# Patient Record
Sex: Male | Born: 1975 | Race: White | Hispanic: No | Marital: Single | Smoking: Current every day smoker
Health system: Southern US, Community
[De-identification: ages and names within clinical notes are randomized; demographics above are authoritative.]

## PROBLEM LIST (undated history)

## (undated) DIAGNOSIS — I10 Essential (primary) hypertension: Secondary | ICD-10-CM

## (undated) DIAGNOSIS — F329 Major depressive disorder, single episode, unspecified: Secondary | ICD-10-CM

## (undated) DIAGNOSIS — E7800 Pure hypercholesterolemia, unspecified: Secondary | ICD-10-CM

## (undated) DIAGNOSIS — F32A Depression, unspecified: Secondary | ICD-10-CM

## (undated) DIAGNOSIS — F419 Anxiety disorder, unspecified: Secondary | ICD-10-CM

## (undated) DIAGNOSIS — K769 Liver disease, unspecified: Secondary | ICD-10-CM

## (undated) HISTORY — DX: Anxiety disorder, unspecified: F41.9

## (undated) HISTORY — PX: MENISCUS REPAIR: SHX5179

## (undated) HISTORY — PX: VASECTOMY: SHX75

## (undated) HISTORY — DX: Major depressive disorder, single episode, unspecified: F32.9

## (undated) HISTORY — DX: Depression, unspecified: F32.A

---

## 2011-08-01 ENCOUNTER — Ambulatory Visit: Payer: Self-pay | Admitting: Physician Assistant

## 2011-08-01 VITALS — BP 138/90 | HR 84 | Temp 98.4°F | Resp 16 | Ht 74.0 in | Wt 184.0 lb

## 2011-08-01 DIAGNOSIS — F419 Anxiety disorder, unspecified: Secondary | ICD-10-CM | POA: Insufficient documentation

## 2011-08-01 DIAGNOSIS — F411 Generalized anxiety disorder: Secondary | ICD-10-CM

## 2011-08-01 MED ORDER — CLONAZEPAM 0.5 MG PO TABS: 0.5000 mg | ORAL_TABLET | Freq: Every day | ORAL | Status: DC

## 2011-08-01 MED ORDER — VENLAFAXINE HCL ER 150 MG PO CP24: 150.0000 mg | ORAL_CAPSULE | Freq: Every day | ORAL | Status: DC

## 2011-08-01 NOTE — Progress Notes (Signed)
  Subjective:    Patient ID: Stephen Klein, male    DOB: 1976/01/11, 36 y.o.   MRN: [id number]  HPI  Pt presents for check of his anxiety.  He is well controlled on Effexor XR 150mg  qd and Klonopin 0.5mg  tid scheduled throughout the day.  [place] the [place] 1[place] time next [place].  He is using less ETOH for anxiety, now only social drinker.  He sees Lonell Face on a prn bases, saw him last week.  He is pleased with his progress.  Review of Systems  Constitutional: Negative for fever and chills.  Psychiatric/Behavioral: The patient is nervous/anxious.        Objective:   Physical Exam  Constitutional: He appears well-developed and well-nourished.  HENT:  Head: Normocephalic and atraumatic.  Eyes: Conjunctivae are normal.  Pulmonary/Chest: Effort normal.  Psychiatric: He has a normal mood and affect. His behavior is normal. Judgment and thought content normal.          Assessment & Plan:   1. Anxiety  clonazePAM (KLONOPIN) 0.5 MG tablet   Will continue current medication regimen for the next 6 months.  Will continue with Effexor XR 150mg .  D/w pt possible increase in Effexor XR to 225mg  to see if his dose of Klonopin can be decreased.  We talked about how to determine if this can happen.  Pt will try over the next 6 months to use Klonopin as prn and if able to decrease dose will know that Effexor if at adequate dose but if unable to decrease Klonopin uses will try to increase.  It makes pt very nervous to change meds because he is so stable at this time but he wants to try this challenge.

## 2011-08-04 ENCOUNTER — Other Ambulatory Visit: Payer: Self-pay | Admitting: Physician Assistant

## 2011-08-04 DIAGNOSIS — F419 Anxiety disorder, unspecified: Secondary | ICD-10-CM

## 2011-08-04 MED ORDER — CLONAZEPAM 0.5 MG PO TABS: 0.5000 mg | ORAL_TABLET | Freq: Three times a day (TID) | ORAL | Status: DC | PRN

## 2011-09-26 ENCOUNTER — Telehealth: Payer: Self-pay | Admitting: Internal Medicine

## 2011-09-26 DIAGNOSIS — F419 Anxiety disorder, unspecified: Secondary | ICD-10-CM

## 2011-09-26 MED ORDER — CLONAZEPAM 0.5 MG PO TABS: 0.5000 mg | ORAL_TABLET | Freq: Three times a day (TID) | ORAL | Status: DC | PRN

## 2011-09-26 NOTE — Telephone Encounter (Signed)
REfilled Clonazepam 0.5 mg #90 no refills, faxed to pharmacy.

## 2011-09-27 ENCOUNTER — Other Ambulatory Visit: Payer: Self-pay | Admitting: Physician Assistant

## 2011-11-29 ENCOUNTER — Telehealth: Payer: Self-pay

## 2011-11-29 DIAGNOSIS — F419 Anxiety disorder, unspecified: Secondary | ICD-10-CM

## 2011-11-29 MED ORDER — CLONAZEPAM 0.5 MG PO TABS: 0.5000 mg | ORAL_TABLET | Freq: Three times a day (TID) | ORAL | Status: DC | PRN

## 2011-11-29 NOTE — Telephone Encounter (Signed)
Rx printed

## 2011-11-29 NOTE — Telephone Encounter (Signed)
Cherry at [facility] is requesting clonazePAM (KLONOPIN) 0.5 MG tablet pleae call [phone]

## 2011-11-30 NOTE — Telephone Encounter (Signed)
Called in Rx to pharmacy.

## 2012-01-31 ENCOUNTER — Telehealth: Payer: Self-pay

## 2012-01-31 DIAGNOSIS — F419 Anxiety disorder, unspecified: Secondary | ICD-10-CM

## 2012-01-31 MED ORDER — CLONAZEPAM 0.5 MG PO TABS: 0.5000 mg | ORAL_TABLET | Freq: Three times a day (TID) | ORAL | Status: DC | PRN

## 2012-01-31 MED ORDER — VENLAFAXINE HCL ER 150 MG PO CP24: 150.0000 mg | ORAL_CAPSULE | Freq: Every day | ORAL | Status: DC

## 2012-01-31 NOTE — Telephone Encounter (Signed)
Pharmacist called checking on faxed request she had sent for RFs on pt's Effexor and clonazepam. Can we RF this month and then pt needs to RTC?

## 2012-01-31 NOTE — Telephone Encounter (Signed)
Refills sent in/printed. Needs ov.

## 2012-02-01 ENCOUNTER — Other Ambulatory Visit: Payer: Self-pay | Admitting: Family Medicine

## 2012-02-01 DIAGNOSIS — F419 Anxiety disorder, unspecified: Secondary | ICD-10-CM

## 2012-02-01 MED ORDER — CLONAZEPAM 0.5 MG PO TABS: 0.5000 mg | ORAL_TABLET | Freq: Three times a day (TID) | ORAL | Status: DC | PRN

## 2012-02-01 NOTE — Telephone Encounter (Signed)
Attempted to call patient, but phone # disconnected I have called and spoken to pharmacy and they will advise patient he is due for follow up before these meds are renewed again.

## 2012-02-01 NOTE — Addendum Note (Signed)
Addended by: Jacqualyn Posey on: 02/01/2012 10:58 AM   Modules accepted: Orders

## 2012-03-25 ENCOUNTER — Ambulatory Visit: Payer: Self-pay | Admitting: Internal Medicine

## 2012-03-25 VITALS — BP 136/89 | HR 103 | Temp 98.5°F | Resp 16 | Ht 74.75 in | Wt 185.2 lb

## 2012-03-25 DIAGNOSIS — F411 Generalized anxiety disorder: Secondary | ICD-10-CM

## 2012-03-25 DIAGNOSIS — F17200 Nicotine dependence, unspecified, uncomplicated: Secondary | ICD-10-CM

## 2012-03-25 DIAGNOSIS — F419 Anxiety disorder, unspecified: Secondary | ICD-10-CM

## 2012-03-25 MED ORDER — CLONAZEPAM 0.5 MG PO TABS: 0.5000 mg | ORAL_TABLET | Freq: Three times a day (TID) | ORAL | Status: DC | PRN

## 2012-03-25 MED ORDER — VENLAFAXINE HCL ER 150 MG PO CP24: 150.0000 mg | ORAL_CAPSULE | Freq: Every day | ORAL | Status: AC

## 2012-03-26 DIAGNOSIS — F17200 Nicotine dependence, unspecified, uncomplicated: Secondary | ICD-10-CM | POA: Insufficient documentation

## 2012-03-26 NOTE — Progress Notes (Signed)
F/u for med refill  Problem #1 anxiety #2 nicotine addiction-not ready to address  See last OV w/ PAC [facility] 6 mos WNU:UVOZD very well with Effexor 150 XR/needs only occasional Clonopin, at the most 2 doses today and times a great stress Has only occasional counseling visits with Lonell Face but is doing well No problems with depression or substance abuse  Exam Vital signs stable Oriented to time person and place Mood stable affect good Judgment sound  Problem #1 generalized anxiety disorder  Meds ordered this encounter  Medications  . clonazePAM (KLONOPIN) 0.5 MG tablet    Sig: Take 1 tablet (0.5 mg total) by mouth 3 (three) times daily as needed for anxiety.    Dispense:  90 tablet    Refill:  5  . venlafaxine XR (EFFEXOR-XR) 150 MG 24 hr capsule    Sig: Take 1 capsule (150 mg total) by mouth daily.    Dispense:  30 capsule    Refill:  5   Recheck 6 months

## 2013-03-18 ENCOUNTER — Emergency Department (HOSPITAL_COMMUNITY)
Admission: EM | Admit: 2013-03-18 | Discharge: 2013-03-18 | Disposition: A | Discharge status: Home / Self Care | Payer: BC Managed Care – PPO | Attending: Emergency Medicine | Admitting: Emergency Medicine

## 2013-03-18 ENCOUNTER — Encounter (HOSPITAL_COMMUNITY): Payer: Self-pay | Admitting: Emergency Medicine

## 2013-03-18 DIAGNOSIS — Y9301 Activity, walking, marching and hiking: Secondary | ICD-10-CM | POA: Insufficient documentation

## 2013-03-18 DIAGNOSIS — F3289 Other specified depressive episodes: Secondary | ICD-10-CM | POA: Insufficient documentation

## 2013-03-18 DIAGNOSIS — F411 Generalized anxiety disorder: Secondary | ICD-10-CM | POA: Insufficient documentation

## 2013-03-18 DIAGNOSIS — M549 Dorsalgia, unspecified: Secondary | ICD-10-CM

## 2013-03-18 DIAGNOSIS — F329 Major depressive disorder, single episode, unspecified: Secondary | ICD-10-CM | POA: Insufficient documentation

## 2013-03-18 DIAGNOSIS — X503XXA Overexertion from repetitive movements, initial encounter: Secondary | ICD-10-CM | POA: Insufficient documentation

## 2013-03-18 DIAGNOSIS — Z79899 Other long term (current) drug therapy: Secondary | ICD-10-CM | POA: Insufficient documentation

## 2013-03-18 DIAGNOSIS — F17200 Nicotine dependence, unspecified, uncomplicated: Secondary | ICD-10-CM | POA: Insufficient documentation

## 2013-03-18 DIAGNOSIS — IMO0002 Reserved for concepts with insufficient information to code with codable children: Secondary | ICD-10-CM | POA: Insufficient documentation

## 2013-03-18 DIAGNOSIS — Z791 Long term (current) use of non-steroidal anti-inflammatories (NSAID): Secondary | ICD-10-CM | POA: Insufficient documentation

## 2013-03-18 DIAGNOSIS — Y9289 Other specified places as the place of occurrence of the external cause: Secondary | ICD-10-CM | POA: Insufficient documentation

## 2013-03-18 MED ORDER — OXYCODONE-ACETAMINOPHEN 5-325 MG PO TABS
2.0000 | ORAL_TABLET | Freq: Once | ORAL | Status: AC
  Administered 2013-03-18: 2 via ORAL
  Filled 2013-03-18: qty 2

## 2013-03-18 MED ORDER — DIAZEPAM 5 MG PO TABS: 5.0000 mg | ORAL_TABLET | Freq: Two times a day (BID) | ORAL | Status: AC

## 2013-03-18 MED ORDER — DIAZEPAM 5 MG PO TABS
5.0000 mg | ORAL_TABLET | Freq: Once | ORAL | Status: AC
  Administered 2013-03-18: 5 mg via ORAL
  Filled 2013-03-18: qty 1

## 2013-03-18 MED ORDER — HYDROCODONE-ACETAMINOPHEN 10-325 MG PO TABS: 1.0000 | ORAL_TABLET | Freq: Four times a day (QID) | ORAL | Status: DC | PRN

## 2013-03-18 MED ORDER — HYDROMORPHONE HCL PF 1 MG/ML IJ SOLN
1.0000 mg | Freq: Once | INTRAMUSCULAR | Status: AC
  Administered 2013-03-18: 1 mg via INTRAMUSCULAR
  Filled 2013-03-18: qty 1

## 2013-03-18 NOTE — ED Provider Notes (Signed)
CSN: [id number]     Arrival date & time 03/18/13  1617 History   First MD Initiated Contact with Patient 03/18/13 1636     Chief Complaint  Patient presents with  . Back Pain    HPI Patient presents with low back pain.  The pain is across the lower back with radiation down the posterior of the right leg.  Pain began approximately one month ago after a seemingly innocuous event.  The patient was walking dogs, felt a pop, and since that time has had pain persistently in the lower back.  Pain is severe, incapacitating.  Patient has seen neurosurgery, had MRI.  This was a week ago.  Patient notes that there is incomplete relief with Skelaxin, Norco, ibuprofen. Today, the patient denies incontinence, bowel or bladder changes, no lower extremity weakness or dysesthesia.  He also denies any fevers, chills, nausea, vomiting.  Past Medical History  Diagnosis Date  . Depression   . Anxiety    History reviewed. No pertinent past surgical history. History reviewed. No pertinent family history. History  Substance Use Topics  . Smoking status: Current Every Day Smoker -- 1.00 packs/day for 8 years  . Smokeless tobacco: Not on file  . Alcohol Use: 7.5 oz/week    15 drink(s) per week    Review of Systems  Constitutional:       Per HPI, otherwise negative  HENT:       Per HPI, otherwise negative  Respiratory:       Per HPI, otherwise negative  Cardiovascular:       Per HPI, otherwise negative  Gastrointestinal: Negative for vomiting.  Endocrine:       Negative aside from HPI  Genitourinary:       Neg aside from HPI   Musculoskeletal:       Per HPI, otherwise negative  Skin: Negative.   Neurological: Negative for syncope.    Allergies  Sulfa antibiotics  Home Medications   Current Outpatient Rx  Name  Route  Sig  Dispense  Refill  . clonazePAM (KLONOPIN) 0.5 MG tablet   Oral   Take 1 tablet (0.5 mg total) by mouth 3 (three) times daily as needed for anxiety.   90 tablet   5    . diclofenac sodium (VOLTAREN) 1 % GEL   Topical   Apply 2 g topically 2 (two) times daily.         Marland Kitchen HYDROcodone-acetaminophen (NORCO/VICODIN) 5-325 MG per tablet   Oral   Take 1 tablet by mouth every 6 (six) hours as needed for pain.         . metaxalone (SKELAXIN) 800 MG tablet   Oral   Take 800 mg by mouth 3 (three) times daily.         . Multiple Vitamin (MULTIVITAMIN WITH MINERALS) TABS tablet   Oral   Take 1 tablet by mouth daily.         Marland Kitchen venlafaxine XR (EFFEXOR-XR) 150 MG 24 hr capsule   Oral   Take 1 capsule (150 mg total) by mouth daily.   30 capsule   5    BP 176/99  Pulse 112  Temp(Src) 98 F (36.7 C) (Oral)  Resp 16  SpO2 98% Physical Exam  Nursing note and vitals reviewed. Constitutional: He is oriented to person, place, and time. He appears well-developed. No distress.  HENT:  Head: Normocephalic and atraumatic.  Eyes: Conjunctivae and EOM are normal.  Cardiovascular: Normal rate and regular rhythm.  Pulmonary/Chest: Effort normal. No stridor. No respiratory distress.  Abdominal: He exhibits no distension.  Musculoskeletal: He exhibits no edema.  No gross deformities throughout the lower back or legs.  There is mild tenderness to palpation across the top of the sacrum, with no asymmetry, no visible abnormalities.  Neurological: He is alert and oriented to person, place, and time.  Patient has appropriate sensation throughout.  Strength in the right thigh is minimally less than the left side.  Otherwise unremarkable neurologic exam the  Skin: Skin is warm and dry.  Psychiatric: He has a normal mood and affect.    ED Course  Procedures (including critical care time) Labs Review Labs Reviewed - No data to display Imaging Review No results found.  EKG Interpretation   None      after the initial evaluation I reviewed the patient's MRI results, and the interpretation.  Interpretation is notable for being essentially normal.   7:12  PM Patient has some improvement in his level of pain.  He denies any new concerns. I had a lengthy discussion with him and his wife about back pain, the need for analgesia, ongoing management with primary care and neurosurgery/orthopedics. MDM  No diagnosis found. This patient presents with concern ongoing low back pain.  On exam the patient is neurologically intact.  Patient's MRI does not demonstrate acute pathology in the lumbar spine.  Patient remained medically stable throughout his emergency department course.  Following improvement in his pain level him a progress for discharge with further evaluation and management as an outpatient.  He started on a new course of medication after a lengthy discussion on back pain, return precautions, follow up instructions.    Gerhard Munch, MD 03/18/13 [phone]

## 2013-03-18 NOTE — ED Notes (Signed)
Presents with back pain began in September, went to spine clinic, had MRI last Monday has not been able to get back in touch with spine clinic since. Pt is unable to get pain under control. Denies numbness, tingling in legs, denies loss of bowel and bladder.

## 2014-05-12 ENCOUNTER — Ambulatory Visit (HOSPITAL_BASED_OUTPATIENT_CLINIC_OR_DEPARTMENT_OTHER): Payer: BC Managed Care – PPO | Attending: Family Medicine | Admitting: Radiology

## 2014-05-12 VITALS — Ht 74.0 in | Wt 200.0 lb

## 2014-05-12 DIAGNOSIS — G4733 Obstructive sleep apnea (adult) (pediatric): Secondary | ICD-10-CM | POA: Insufficient documentation

## 2014-05-17 DIAGNOSIS — G4733 Obstructive sleep apnea (adult) (pediatric): Secondary | ICD-10-CM

## 2014-05-17 NOTE — Sleep Study (Signed)
   NAME: Stephen MaskerDavid Enerson DATE OF BIRTH:  [birth year] MEDICAL RECORD NUMBER [id number]  LOCATION: [facility]  PHYSICIAN: YOUNG,CLINTON D  DATE OF STUDY: 05/12/2014  SLEEP STUDY TYPE: Nocturnal Polysomnogram               REFERRING PHYSICIAN: [name], [name], MD  INDICATION FOR STUDY: Hypersomnia with sleep apnea  EPWORTH SLEEPINESS SCORE:   19/24 HEIGHT: 6\' 2"  (188 cm)  WEIGHT: 200 lb (90.719 kg)    Body mass index is 25.67 kg/(m^2).  NECK SIZE: 15 in.  MEDICATIONS: Charted for review  SLEEP ARCHITECTURE: Split study protocol. During the diagnostic phase, total sleep time 130.5 minutes with sleep efficiency 72.1%. Stage I was 8.4%, stage II 84.3%, stage III absent, REM 7.3% of total sleep time. Sleep latency 44 minutes, REM latency 110 minutes, awake after sleep onset 4.5 minutes, arousal index 23.9, bedtime medication: Effexor, Klonopin  RESPIRATORY DATA: Apnea hypopnea index (AHI) 36.3 per hour. 79 total events scored including 15 obstructive apneas and 64 hypopneas. Events were more common while supine. REM AHI 94.7 per hour. CPAP titration to 12 CWP, AHI 1.1 per hour. He wore a large fullface mask.  OXYGEN DATA: Moderately loud snoring before CPAP with oxygen desaturation to a nadir of 79% on room air. With CPAP control, snoring was prevented and mean oxygen saturation was 95.2%.  CARDIAC DATA: Normal sinus rhythm  MOVEMENT/PARASOMNIA: No significant movement disturbance, no bathroom trips  IMPRESSION/ RECOMMENDATION:   1) Severe obstructive sleep apnea/hypopnea syndrome, AHI 36.3 per hour with events were, in while supine. REM AHI 94.7 per hour. Moderate snoring with oxygen desaturation to a nadir of 79% on room air. 2) Successful CPAP titration to 12 CWP, AHI 1.1 per hour. He wore a large ResMed AirFit F-10 fullface mask with heated humidifier. Snoring was prevented and mean oxygen saturation was 95.2%.   Waymon BudgeYOUNG,CLINTON D Diplomate, American Board of Sleep  Medicine  ELECTRONICALLY SIGNED ON:  05/17/2014, 9:55 AM [facility] PH: [phone]   FX: [phone] ACCREDITED BY THE AMERICAN ACADEMY OF SLEEP MEDICINE

## 2014-07-11 ENCOUNTER — Encounter (HOSPITAL_COMMUNITY): Payer: Self-pay | Admitting: Emergency Medicine

## 2014-07-11 ENCOUNTER — Emergency Department (HOSPITAL_COMMUNITY): Payer: BLUE CROSS/BLUE SHIELD

## 2014-07-11 ENCOUNTER — Inpatient Hospital Stay (HOSPITAL_COMMUNITY)
Admission: EM | Admit: 2014-07-11 | Discharge: 2014-07-14 | DRG: 872 | Disposition: A | Discharge status: Home / Self Care | Payer: BLUE CROSS/BLUE SHIELD | Attending: Internal Medicine | Admitting: Internal Medicine

## 2014-07-11 DIAGNOSIS — F1099 Alcohol use, unspecified with unspecified alcohol-induced disorder: Secondary | ICD-10-CM | POA: Diagnosis present

## 2014-07-11 DIAGNOSIS — K760 Fatty (change of) liver, not elsewhere classified: Secondary | ICD-10-CM | POA: Diagnosis present

## 2014-07-11 DIAGNOSIS — I2510 Atherosclerotic heart disease of native coronary artery without angina pectoris: Secondary | ICD-10-CM | POA: Diagnosis present

## 2014-07-11 DIAGNOSIS — D696 Thrombocytopenia, unspecified: Secondary | ICD-10-CM | POA: Diagnosis present

## 2014-07-11 DIAGNOSIS — Z7289 Other problems related to lifestyle: Secondary | ICD-10-CM

## 2014-07-11 DIAGNOSIS — J029 Acute pharyngitis, unspecified: Secondary | ICD-10-CM | POA: Diagnosis present

## 2014-07-11 DIAGNOSIS — K769 Liver disease, unspecified: Secondary | ICD-10-CM | POA: Diagnosis present

## 2014-07-11 DIAGNOSIS — E871 Hypo-osmolality and hyponatremia: Secondary | ICD-10-CM | POA: Diagnosis present

## 2014-07-11 DIAGNOSIS — N419 Inflammatory disease of prostate, unspecified: Secondary | ICD-10-CM | POA: Diagnosis present

## 2014-07-11 DIAGNOSIS — Z882 Allergy status to sulfonamides status: Secondary | ICD-10-CM

## 2014-07-11 DIAGNOSIS — R7989 Other specified abnormal findings of blood chemistry: Secondary | ICD-10-CM | POA: Diagnosis present

## 2014-07-11 DIAGNOSIS — R109 Unspecified abdominal pain: Secondary | ICD-10-CM

## 2014-07-11 DIAGNOSIS — N12 Tubulo-interstitial nephritis, not specified as acute or chronic: Secondary | ICD-10-CM | POA: Diagnosis present

## 2014-07-11 DIAGNOSIS — I10 Essential (primary) hypertension: Secondary | ICD-10-CM | POA: Diagnosis present

## 2014-07-11 DIAGNOSIS — E785 Hyperlipidemia, unspecified: Secondary | ICD-10-CM | POA: Diagnosis present

## 2014-07-11 DIAGNOSIS — Z79899 Other long term (current) drug therapy: Secondary | ICD-10-CM

## 2014-07-11 DIAGNOSIS — A419 Sepsis, unspecified organism: Principal | ICD-10-CM | POA: Diagnosis present

## 2014-07-11 DIAGNOSIS — E780 Pure hypercholesterolemia: Secondary | ICD-10-CM | POA: Diagnosis present

## 2014-07-11 DIAGNOSIS — Z789 Other specified health status: Secondary | ICD-10-CM

## 2014-07-11 DIAGNOSIS — R945 Abnormal results of liver function studies: Secondary | ICD-10-CM | POA: Diagnosis present

## 2014-07-11 DIAGNOSIS — K5900 Constipation, unspecified: Secondary | ICD-10-CM | POA: Diagnosis present

## 2014-07-11 DIAGNOSIS — B951 Streptococcus, group B, as the cause of diseases classified elsewhere: Secondary | ICD-10-CM

## 2014-07-11 DIAGNOSIS — F419 Anxiety disorder, unspecified: Secondary | ICD-10-CM | POA: Diagnosis present

## 2014-07-11 DIAGNOSIS — F329 Major depressive disorder, single episode, unspecified: Secondary | ICD-10-CM | POA: Diagnosis present

## 2014-07-11 DIAGNOSIS — F17210 Nicotine dependence, cigarettes, uncomplicated: Secondary | ICD-10-CM | POA: Diagnosis present

## 2014-07-11 HISTORY — DX: Liver disease, unspecified: K76.9

## 2014-07-11 HISTORY — DX: Essential (primary) hypertension: I10

## 2014-07-11 HISTORY — DX: Pure hypercholesterolemia, unspecified: E78.00

## 2014-07-11 LAB — COMPREHENSIVE METABOLIC PANEL
ALK PHOS: 41 U/L (ref 39–117)
ALT: 98 U/L — ABNORMAL HIGH (ref 0–53)
ANION GAP: 10 (ref 5–15)
AST: 48 U/L — AB (ref 0–37)
Albumin: 4.4 g/dL (ref 3.5–5.2)
BILIRUBIN TOTAL: 0.6 mg/dL (ref 0.3–1.2)
BUN: 8 mg/dL (ref 6–23)
CALCIUM: 9 mg/dL (ref 8.4–10.5)
CO2: 23 mmol/L (ref 19–32)
CREATININE: 0.82 mg/dL (ref 0.50–1.35)
Chloride: 98 mmol/L (ref 96–112)
GFR calc Af Amer: >90 mL/min (ref >90)
Glucose, Bld: 145 mg/dL — ABNORMAL HIGH (ref 70–99)
Potassium: 3.8 mmol/L (ref 3.5–5.1)
Sodium: 131 mmol/L — ABNORMAL LOW (ref 135–145)
Total Protein: 7.4 g/dL (ref 6.0–8.3)

## 2014-07-11 LAB — URINALYSIS, ROUTINE W REFLEX MICROSCOPIC
Bilirubin Urine: NEGATIVE
Glucose, UA: NEGATIVE mg/dL
Hgb urine dipstick: NEGATIVE
Ketones, ur: NEGATIVE mg/dL
LEUKOCYTES UA: NEGATIVE
Nitrite: NEGATIVE
PROTEIN: NEGATIVE mg/dL
SPECIFIC GRAVITY, URINE: 1.003 — AB (ref 1.005–1.030)
UROBILINOGEN UA: 0.2 mg/dL (ref 0.0–1.0)
pH: 6.5 (ref 5.0–8.0)

## 2014-07-11 LAB — CBC WITH DIFFERENTIAL/PLATELET
BASOS ABS: 0 10*3/uL (ref 0.0–0.1)
BASOS PCT: 0 % (ref 0–1)
EOS PCT: 1 % (ref 0–5)
Eosinophils Absolute: 0.1 10*3/uL (ref 0.0–0.7)
HCT: 43 % (ref 39.0–52.0)
HEMOGLOBIN: 15 g/dL (ref 13.0–17.0)
LYMPHS PCT: 11 % — AB (ref 12–46)
Lymphs Abs: 1.4 10*3/uL (ref 0.7–4.0)
MCH: 29.5 pg (ref 26.0–34.0)
MCHC: 34.9 g/dL (ref 30.0–36.0)
MCV: 84.5 fL (ref 78.0–100.0)
MONO ABS: 1.2 10*3/uL — AB (ref 0.1–1.0)
Monocytes Relative: 9 % (ref 3–12)
Neutro Abs: 10.6 10*3/uL — ABNORMAL HIGH (ref 1.7–7.7)
Neutrophils Relative %: 79 % — ABNORMAL HIGH (ref 43–77)
Platelets: 143 10*3/uL — ABNORMAL LOW (ref 150–400)
RBC: 5.09 MIL/uL (ref 4.22–5.81)
RDW: 12.7 % (ref 11.5–15.5)
WBC: 13.4 10*3/uL — ABNORMAL HIGH (ref 4.0–10.5)

## 2014-07-11 LAB — RAPID STREP SCREEN (MED CTR MEBANE ONLY): Streptococcus, Group A Screen (Direct): POSITIVE — AB

## 2014-07-11 LAB — I-STAT CG4 LACTIC ACID, ED: Lactic Acid, Venous: 2.01 mmol/L (ref 0.5–2.0)

## 2014-07-11 MED ORDER — POLYETHYLENE GLYCOL 3350 17 G PO PACK
17.0000 g | PACK | Freq: Every day | ORAL | Status: DC
  Filled 2014-07-11 (×4): qty 1

## 2014-07-11 MED ORDER — LORAZEPAM 1 MG PO TABS: 1.0000 mg | ORAL_TABLET | Freq: Four times a day (QID) | ORAL | Status: AC | PRN

## 2014-07-11 MED ORDER — IOHEXOL 300 MG/ML  SOLN
25.0000 mL | Freq: Once | INTRAMUSCULAR | Status: AC | PRN
  Administered 2014-07-11: 25 mL via ORAL

## 2014-07-11 MED ORDER — LORAZEPAM 2 MG/ML IJ SOLN: 1.0000 mg | Freq: Four times a day (QID) | INTRAMUSCULAR | Status: AC | PRN

## 2014-07-11 MED ORDER — PIPERACILLIN-TAZOBACTAM 3.375 G IVPB 30 MIN: 3.3750 g | Freq: Once | INTRAVENOUS | Status: DC

## 2014-07-11 MED ORDER — ONDANSETRON HCL 4 MG PO TABS: 4.0000 mg | ORAL_TABLET | Freq: Four times a day (QID) | ORAL | Status: DC | PRN

## 2014-07-11 MED ORDER — FOLIC ACID 1 MG PO TABS
1.0000 mg | ORAL_TABLET | Freq: Every day | ORAL | Status: DC
  Administered 2014-07-11 – 2014-07-14 (×4): 1 mg via ORAL
  Filled 2014-07-11 (×4): qty 1

## 2014-07-11 MED ORDER — VENLAFAXINE HCL ER 150 MG PO CP24
150.0000 mg | ORAL_CAPSULE | Freq: Every day | ORAL | Status: DC
  Administered 2014-07-11 – 2014-07-13 (×3): 150 mg via ORAL
  Filled 2014-07-11 (×5): qty 1

## 2014-07-11 MED ORDER — CLONAZEPAM 0.5 MG PO TABS
0.5000 mg | ORAL_TABLET | Freq: Three times a day (TID) | ORAL | Status: DC
  Administered 2014-07-11 – 2014-07-14 (×10): 0.5 mg via ORAL
  Filled 2014-07-11 (×10): qty 1

## 2014-07-11 MED ORDER — ADULT MULTIVITAMIN W/MINERALS CH
1.0000 | ORAL_TABLET | Freq: Every day | ORAL | Status: DC
  Administered 2014-07-11 – 2014-07-14 (×4): 1 via ORAL
  Filled 2014-07-11 (×4): qty 1

## 2014-07-11 MED ORDER — DEXTROSE 5 % IV SOLN
2.0000 g | Freq: Once | INTRAVENOUS | Status: AC
  Administered 2014-07-11: 2 g via INTRAVENOUS
  Filled 2014-07-11: qty 2

## 2014-07-11 MED ORDER — SODIUM CHLORIDE 0.9 % IV BOLUS (SEPSIS)
1000.0000 mL | Freq: Once | INTRAVENOUS | Status: AC
  Administered 2014-07-11: 1000 mL via INTRAVENOUS

## 2014-07-11 MED ORDER — PHENOL 1.4 % MT LIQD
1.0000 | OROMUCOSAL | Status: DC | PRN
Start: 2014-07-11 — End: 2014-07-14
  Filled 2014-07-11 (×2): qty 177

## 2014-07-11 MED ORDER — IOHEXOL 300 MG/ML  SOLN
100.0000 mL | Freq: Once | INTRAMUSCULAR | Status: AC | PRN
  Administered 2014-07-11: 100 mL via INTRAVENOUS

## 2014-07-11 MED ORDER — SODIUM CHLORIDE 0.9 % IV BOLUS (SEPSIS)
2000.0000 mL | Freq: Once | INTRAVENOUS | Status: AC
  Administered 2014-07-11: 2000 mL via INTRAVENOUS

## 2014-07-11 MED ORDER — ACETAMINOPHEN 325 MG PO TABS
650.0000 mg | ORAL_TABLET | Freq: Four times a day (QID) | ORAL | Status: DC | PRN
  Administered 2014-07-11: 650 mg via ORAL
  Filled 2014-07-11: qty 2

## 2014-07-11 MED ORDER — THIAMINE HCL 100 MG/ML IJ SOLN
100.0000 mg | Freq: Every day | INTRAMUSCULAR | Status: DC
  Filled 2014-07-11 (×4): qty 1

## 2014-07-11 MED ORDER — ASPIRIN EC 81 MG PO TBEC
81.0000 mg | DELAYED_RELEASE_TABLET | Freq: Every day | ORAL | Status: DC
  Administered 2014-07-11 – 2014-07-14 (×4): 81 mg via ORAL
  Filled 2014-07-11 (×4): qty 1

## 2014-07-11 MED ORDER — ZOLPIDEM TARTRATE 5 MG PO TABS
5.0000 mg | ORAL_TABLET | Freq: Every evening | ORAL | Status: DC | PRN
Start: 2014-07-11 — End: 2014-07-14

## 2014-07-11 MED ORDER — TAMSULOSIN HCL 0.4 MG PO CAPS
0.4000 mg | ORAL_CAPSULE | Freq: Every day | ORAL | Status: DC
  Administered 2014-07-12 – 2014-07-14 (×3): 0.4 mg via ORAL
  Filled 2014-07-11 (×4): qty 1

## 2014-07-11 MED ORDER — VITAMIN B-1 100 MG PO TABS
100.0000 mg | ORAL_TABLET | Freq: Every day | ORAL | Status: DC
  Administered 2014-07-11 – 2014-07-14 (×4): 100 mg via ORAL
  Filled 2014-07-11 (×4): qty 1

## 2014-07-11 MED ORDER — MORPHINE SULFATE 2 MG/ML IJ SOLN
2.0000 mg | INTRAMUSCULAR | Status: DC | PRN
  Administered 2014-07-11 (×2): 4 mg via INTRAVENOUS
  Administered 2014-07-11: 2 mg via INTRAVENOUS
  Administered 2014-07-12 – 2014-07-13 (×7): 4 mg via INTRAVENOUS
  Administered 2014-07-13 (×2): 2 mg via INTRAVENOUS
  Administered 2014-07-13: 4 mg via INTRAVENOUS
  Administered 2014-07-13: 2 mg via INTRAVENOUS
  Administered 2014-07-13: 4 mg via INTRAVENOUS
  Administered 2014-07-13 – 2014-07-14 (×2): 2 mg via INTRAVENOUS
  Administered 2014-07-14 (×2): 4 mg via INTRAVENOUS
  Filled 2014-07-11: qty 1
  Filled 2014-07-11 (×2): qty 2
  Filled 2014-07-11: qty 1
  Filled 2014-07-11 (×3): qty 2
  Filled 2014-07-11: qty 1
  Filled 2014-07-11 (×9): qty 2
  Filled 2014-07-11: qty 1
  Filled 2014-07-11: qty 2

## 2014-07-11 MED ORDER — CEFTRIAXONE SODIUM IN DEXTROSE 20 MG/ML IV SOLN
1.0000 g | INTRAVENOUS | Status: DC
  Administered 2014-07-12 – 2014-07-14 (×3): 1 g via INTRAVENOUS
  Filled 2014-07-11 (×3): qty 50

## 2014-07-11 MED ORDER — DOCUSATE SODIUM 100 MG PO CAPS
100.0000 mg | ORAL_CAPSULE | Freq: Two times a day (BID) | ORAL | Status: DC
  Administered 2014-07-12 – 2014-07-14 (×3): 100 mg via ORAL
  Filled 2014-07-11 (×9): qty 1

## 2014-07-11 MED ORDER — DILTIAZEM HCL ER 240 MG PO CP24
240.0000 mg | ORAL_CAPSULE | Freq: Every day | ORAL | Status: DC
  Administered 2014-07-11 – 2014-07-13 (×3): 240 mg via ORAL
  Filled 2014-07-11 (×6): qty 1

## 2014-07-11 MED ORDER — ACETAMINOPHEN 650 MG RE SUPP: 650.0000 mg | Freq: Four times a day (QID) | RECTAL | Status: DC | PRN

## 2014-07-11 MED ORDER — MAGIC MOUTHWASH
5.0000 mL | Freq: Three times a day (TID) | ORAL | Status: DC
  Administered 2014-07-11 – 2014-07-14 (×8): 5 mL via ORAL
  Filled 2014-07-11 (×11): qty 5

## 2014-07-11 MED ORDER — OXYCODONE HCL 5 MG PO TABS: 5.0000 mg | ORAL_TABLET | ORAL | Status: DC | PRN

## 2014-07-11 MED ORDER — SODIUM CHLORIDE 0.9 % IV SOLN
INTRAVENOUS | Status: DC
  Administered 2014-07-11: 1000 mL via INTRAVENOUS
  Administered 2014-07-11 – 2014-07-13 (×5): via INTRAVENOUS

## 2014-07-11 MED ORDER — MORPHINE SULFATE 4 MG/ML IJ SOLN
6.0000 mg | Freq: Once | INTRAMUSCULAR | Status: AC
  Administered 2014-07-11: 6 mg via INTRAVENOUS
  Filled 2014-07-11: qty 2

## 2014-07-11 MED ORDER — ACETAMINOPHEN 500 MG PO TABS
1000.0000 mg | ORAL_TABLET | Freq: Once | ORAL | Status: AC
  Administered 2014-07-11: 1000 mg via ORAL
  Filled 2014-07-11: qty 2

## 2014-07-11 MED ORDER — HYDROMORPHONE HCL 1 MG/ML IJ SOLN
1.0000 mg | Freq: Once | INTRAMUSCULAR | Status: AC
  Administered 2014-07-11: 1 mg via INTRAVENOUS
  Filled 2014-07-11: qty 1

## 2014-07-11 MED ORDER — ONDANSETRON HCL 4 MG/2ML IJ SOLN: 4.0000 mg | Freq: Four times a day (QID) | INTRAMUSCULAR | Status: DC | PRN

## 2014-07-11 MED ORDER — KETOROLAC TROMETHAMINE 30 MG/ML IJ SOLN
30.0000 mg | Freq: Once | INTRAMUSCULAR | Status: AC
  Administered 2014-07-11: 30 mg via INTRAVENOUS
  Filled 2014-07-11: qty 1

## 2014-07-11 MED ORDER — ALUM & MAG HYDROXIDE-SIMETH 200-200-20 MG/5ML PO SUSP: 30.0000 mL | Freq: Four times a day (QID) | ORAL | Status: DC | PRN

## 2014-07-11 NOTE — ED Notes (Signed)
Pt. reports low back pain with dysuria , bladder pressure and fever onset today .

## 2014-07-11 NOTE — ED Provider Notes (Signed)
CSN: [id number]     Arrival date & time 07/11/14  0031 History   None    This chart was scribed for Tomasita Crumble, MD by Arlan Organ, ED Scribe. This patient was seen in room B18C/B18C and the patient's care was started 1:28 AM.   Chief Complaint  Patient presents with  . Back Pain  . Dysuria  . Fever   The history is provided by the patient. No language interpreter was used.    HPI Comments: Nedim Oki is a 39 y.o. male with a PMHx of HTN, ventral hernia and liver disease who presents to the Emergency Department complaining of constant, moderate lower back pain onset 3 PM this afternoon. He also reports urinary frequency, HA, chills,  "pressure" when urinating, sore throat and fever. He has not tried any OTC medications or home remedies to help manage symptoms. No recent testicular swelling, testicular pain, or vomiting. Mr. Pitstick denies any change in eating habits since onset of symptoms. Pt with known allergies to sulfa antibiotics.   Past Medical History  Diagnosis Date  . Depression   . Anxiety   . Hypertension   . Hypercholesteremia   . Liver disease    Past Surgical History  Procedure Laterality Date  . Vasectomy     No family history on file. History  Substance Use Topics  . Smoking status: Current Every Day Smoker -- 1.00 packs/day for 8 years  . Smokeless tobacco: Not on file  . Alcohol Use: 7.5 oz/week    15 Standard drinks or equivalent per week    Review of Systems  Constitutional: Positive for fever and chills.  HENT: Positive for sore throat.   Gastrointestinal: Positive for nausea and abdominal pain. Negative for vomiting and diarrhea.  Genitourinary: Positive for frequency. Negative for scrotal swelling and testicular pain.  Musculoskeletal: Positive for back pain.  Neurological: Positive for headaches.      Allergies  Sulfa antibiotics  Home Medications   Prior to Admission medications   Medication Sig Start Date End Date Taking? Authorizing  Provider  diclofenac sodium (VOLTAREN) 1 % GEL Apply 2 g topically 2 (two) times daily.    Historical Provider, MD  HYDROcodone-acetaminophen (NORCO) 10-325 MG per tablet Take 1 tablet by mouth every 6 (six) hours as needed for pain. 03/18/13   Gerhard Munch, MD  Multiple Vitamin (MULTIVITAMIN WITH MINERALS) TABS tablet Take 1 tablet by mouth daily.    Historical Provider, MD  venlafaxine XR (EFFEXOR-XR) 150 MG 24 hr capsule Take 1 capsule (150 mg total) by mouth daily. 03/25/12   Tonye Pearson, MD   Triage Vitals: BP 118/71 mmHg  Pulse 128  Temp(Src) 102.3 F (39.1 C) (Oral)  Resp 14  Ht  (1.88 m)  Wt 200 lb (90.719 kg)  BMI 25.67 kg/m2  SpO2 98%   Physical Exam  Constitutional: He is oriented to person, place, and time. Vital signs are normal. He appears well-developed and well-nourished.  Non-toxic appearance. He does not appear ill. No distress.  HENT:  Head: Normocephalic and atraumatic.  Nose: Nose normal.  Mouth/Throat: Oropharynx is clear and moist. No oropharyngeal exudate.  Eyes: Conjunctivae and EOM are normal. Pupils are equal, round, and reactive to light. No scleral icterus.  Neck: Normal range of motion. Neck supple. No tracheal deviation, no edema, no erythema and normal range of motion present. No thyroid mass and no thyromegaly present.  Cardiovascular: Regular rhythm, S1 normal, S2 normal, normal heart sounds, intact distal pulses and  normal pulses.  Tachycardia present.  Exam reveals no gallop and no friction rub.   No murmur heard. Pulses:      Radial pulses are 2+ on the right side, and 2+ on the left side.       Dorsalis pedis pulses are 2+ on the right side, and 2+ on the left side.  Pulmonary/Chest: Effort normal and breath sounds normal. No respiratory distress. He has no wheezes. He has no rhonchi. He has no rales.  Abdominal: Soft. Normal appearance and bowel sounds are normal. He exhibits no distension, no ascites and no mass. There is no  hepatosplenomegaly. There is tenderness. There is rebound. There is no guarding and no CVA tenderness.  RLQ tender to palpation   Musculoskeletal: Normal range of motion. He exhibits no edema or tenderness.  Lymphadenopathy:    He has no cervical adenopathy.  Neurological: He is alert and oriented to person, place, and time. He has normal strength. No cranial nerve deficit or sensory deficit.  Skin: Skin is warm, dry and intact. No petechiae and no rash noted. He is not diaphoretic. No erythema. No pallor.  Psychiatric: He has a normal mood and affect. His behavior is normal. Judgment normal.  Nursing note and vitals reviewed.   ED Course  Procedures (including critical care time)  DIAGNOSTIC STUDIES: Oxygen Saturation is 98% on RA, Normal by my interpretation.    COORDINATION OF CARE: 1:41 AM- Discussed treatment plan with pt at bedside and pt agreed to plan.     Labs Review Labs Reviewed  CBC WITH DIFFERENTIAL/PLATELET - Abnormal; Notable for the following:    WBC 13.4 (*)    Platelets 143 (*)    Neutrophils Relative % 79 (*)    Neutro Abs 10.6 (*)    Lymphocytes Relative 11 (*)    Monocytes Absolute 1.2 (*)    All other components within normal limits  COMPREHENSIVE METABOLIC PANEL - Abnormal; Notable for the following:    Sodium 131 (*)    Glucose, Bld 145 (*)    AST 48 (*)    ALT 98 (*)    All other components within normal limits  URINALYSIS, ROUTINE W REFLEX MICROSCOPIC - Abnormal; Notable for the following:    Specific Gravity, Urine 1.003 (*)    All other components within normal limits  I-STAT CG4 LACTIC ACID, ED - Abnormal; Notable for the following:    Lactic Acid, Venous 2.01 (*)    All other components within normal limits  URINE CULTURE  CULTURE, BLOOD (ROUTINE X 2)  CULTURE, BLOOD (ROUTINE X 2)    Imaging Review Ct Abdomen Pelvis W Contrast  07/11/2014   CLINICAL DATA:  Moderate constant low back pain beginning at 3 p.m. yesterday. Urinary frequency,  headache, sore throat, fever. History of hypertension, ventral hernia and liver disease.  EXAM: CT ABDOMEN AND PELVIS WITH CONTRAST  TECHNIQUE: Multidetector CT imaging of the abdomen and pelvis was performed using the standard protocol following bolus administration of intravenous contrast.  CONTRAST:  100mL OMNIPAQUE IOHEXOL 300 MG/ML  SOLN  COMPARISON:  None.  FINDINGS: LUNG BASES: Included view of the lung bases demonstrate LEFT lower lobe atelectasis. Visualized heart and pericardium are unremarkable. Pectus excavatum  SOLID ORGANS: The spleen, gallbladder, pancreas and adrenal glands are unremarkable. The liver is diffusely hypodense consistent with hepatic steatosis and otherwise unremarkable.  GASTROINTESTINAL TRACT: The stomach, small and large bowel are normal in course and caliber without inflammatory changes. Normal retrocecal appendix. Moderate amount of  retained large bowel stool.  KIDNEYS/ URINARY TRACT: Kidneys are orthotopic, demonstrating symmetric enhancement. No nephrolithiasis, hydronephrosis or solid renal masses. Nonspecific LEFT perirenal nephric stranding. The unopacified ureters are normal in course and caliber. Urinary bladder is partially distended and unremarkable.  PERITONEUM/RETROPERITONEUM: Aortoiliac vessels are normal in course and caliber, trace calcific atherosclerosis. No lymphadenopathy by CT size criteria. Prostate is enlarged, 5.8 x 4.7 cm. No intraperitoneal free fluid nor free air.  SOFT TISSUE/OSSEOUS STRUCTURES: Non-suspicious. No acute fracture or malalignment of the lumbar spine, no advanced degenerative change for age. Scattered Schmorl's nodes.  IMPRESSION: Nonspecific LEFT perinephric stranding.  Prostatomegaly.  Moderate amount of retained large bowel stool without bowel obstruction.   Electronically Signed   By: Awilda Metro   On: 07/11/2014 04:01     EKG Interpretation   Date/Time:  Saturday July 11 2014 01:05:39 EST Ventricular Rate:  123 PR  Interval:  159 QRS Duration: 97 QT Interval:  296 QTC Calculation: 423 R Axis:   30 Text Interpretation:  Sinus tachycardia Probable left atrial enlargement  Confirmed by Erroll Luna [phone] on 07/11/2014 1:24:52 AM      MDM   Final diagnoses:  None    Patient presents to the emergency department for low back pain, increased urinary frequency, mild abdominal pain. He denies this ever occurring to him in the past. Urinalysis does not show an infection. Abdominal exam is equivocal. He intermittently has right upper and right lower quadrant tenderness to palpation. However patient does have fever to 102 and tachycardia to 120 this will obtain CT scan the abdomen to evaluate for infection.  Patient was given Toradol, morphine, IV fluids in the emergency department. CT scan reveals a left-sided perinephric stranding, concerning for pyelonephritis. Blood cultures were drawn, patient was given 2 g of ceftriaxone emergency department. He continues to be tachycardic and febrile despite interventions. Another fluid bolus was ordered. Patient is admitted to [facility], MedSurg.   CRITICAL CARE Performed by: Tomasita Crumble   Total critical care time: 30 min. sepsis Critical care time was exclusive of separately billable procedures and treating other patients.  Critical care was necessary to treat or prevent imminent or life-threatening deterioration.  Critical care was time spent personally by me on the following activities: development of treatment plan with patient and/or surrogate as well as nursing, discussions with consultants, evaluation of patient's response to treatment, examination of patient, obtaining history from patient or surrogate, ordering and performing treatments and interventions, ordering and review of laboratory studies, ordering and review of radiographic studies, pulse oximetry and re-evaluation of patient's condition.    I personally performed the services  described in this documentation, which was scribed in my presence. The recorded information has been reviewed and is accurate.   Tomasita Crumble, MD 07/11/14 [phone]

## 2014-07-11 NOTE — Progress Notes (Signed)
Stephen McgregorMary Klein with [facility] notified of (+) strep test and fever of 102. No new orders at this time.

## 2014-07-11 NOTE — H&P (Deleted)
[facility] History and Physical                                                                                    Stephen Klein, is a 39 y.o. male  MRN: [id number]   DOB - Sep 24, 1975  Admit Date - 07/11/2014  Outpatient Primary MD for the patient is Stephen RilyJONES,[name], MD  With History of -  Past Medical History  Diagnosis Date  . Depression   . Anxiety   . Hypertension   . Hypercholesteremia   . Liver disease       Past Surgical History  Procedure Laterality Date  . Vasectomy      in for   Chief Complaint  Patient presents with  . Back Pain  . Dysuria  . 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 He presents to the emergency department with lower back pain and sore throat that started at approximately 3 PM yesterday. The patient reports he was feeling fine prior to the start of the symptoms. Since then he has had urinary frequency, nausea, headache, fever and chills, sore throat, and back pain that spans across his lower back. He complains of diffuse myalgias. He tells me that ever his last couple of primary care visits he and his PCP have been working to decrease his high blood pressure.  Workup in the ER included a CT abdomen pelvis that showed left perinephritic stranding, lactic acid of 2.01, sodium 131, WBC 13.4. U/A appears negative for infection. Vital signs: Temperature 102.3, pulse 128, respirations 28.  Review of Systems   In addition to the HPI above,  No Headache, No changes with Vision or hearing, No problems swallowing food or Liquids, No Chest pain, Cough or Shortness of Breath, No Blood in stool or Urine, No new skin rashes or bruises, No new joints pains-aches,    Social History History  Substance Use Topics  . Smoking status: Current Every Day Smoker -- 1.00 packs/day for 8 years  . Smokeless  tobacco: Former NeurosurgeonUser  . Alcohol Use: 11.4 oz/week    15 Standard drinks or equivalent, 4 Cans of beer per week    Family History No family history on file. his father died in a motor vehicle accident. His mother is still living and has hypertension.  Prior to Admission medications   Medication Sig Start Date End Date Taking? Authorizing Provider  clonazePAM (KLONOPIN) 0.5 MG tablet Take 0.5 mg by mouth 3 (three) times daily.   Yes Historical Provider, MD  diltiazem (DILACOR XR) 240 MG 24 hr capsule Take 240 mg by mouth daily.   Yes Historical Provider, MD  valsartan-hydrochlorothiazide (DIOVAN-HCT) 160-12.5 MG per tablet Take 1 tablet by mouth daily.   Yes Historical Provider, MD  venlafaxine XR (EFFEXOR-XR) 150 MG 24 hr capsule Take 1 capsule (150 mg total) by mouth daily. 03/25/12  Yes Tonye Pearsonobert P Doolittle, MD  diclofenac sodium (VOLTAREN) 1 % GEL Apply 2 Klein topically 2 (two) times daily.    Historical Provider, MD  HYDROcodone-acetaminophen (  NORCO) 10-325 MG per tablet Take 1 tablet by mouth every 6 (six) hours as needed for pain. Patient not taking: Reported on 07/11/2014 03/18/13   Gerhard Munch, MD  Multiple Vitamin (MULTIVITAMIN WITH MINERALS) TABS tablet Take 1 tablet by mouth daily.    Historical Provider, MD    Allergies  Allergen Reactions  . Sulfa Antibiotics Other (See Comments)    childhood    Physical Exam  Vitals  Blood pressure 127/68, pulse 96, temperature 98.3 F (36.8 C), temperature source Oral, resp. rate 20, height  (1.88 m), weight 91 kg (200 lb 9.9 oz), SpO2 97 %.   General:  Well-developed pale, male, lying in bed in NAD, appears older than stated age.  Psych:  Normal affect and insight, Not Suicidal or Homicidal, Awake Alert, Oriented X 3.  Neuro:   No F.N deficits, ALL C.Nerves Intact, Strength 5/5 all 4 extremities, Sensation intact all 4 extremities.  ENT:  Ears and Eyes appear Normal, Conjunctivae clear, PER. Moist oral mucosa without erythema  or exudates.  Neck:  Supple, No lymphadenopathy appreciated  Respiratory:  Symmetrical chest wall movement, Good air movement bilaterally, CTAB.  Cardiac:  RRR, No Murmurs, no LE edema noted, no JVD.    Abdomen:  Positive bowel sounds, Soft, Non tender, Non distended,  No masses appreciated, positive CVA tenderness. (Interestingly on the right)  Skin:  No Cyanosis, Normal Skin Turgor, No Skin Rash or Bruise.  Extremities:  Able to move all 4. 5/5 strength in each,  no effusions.  Data Review  CBC  Recent Labs Lab 07/11/14 0058  WBC 13.4*  HGB 15.0  HCT 43.0  PLT 143*  MCV 84.5  MCH 29.5  MCHC 34.9  RDW 12.7  LYMPHSABS 1.4  MONOABS 1.2*  EOSABS 0.1  BASOSABS 0.0    Chemistries   Recent Labs Lab 07/11/14 0058  NA 131*  K 3.8  CL 98  CO2 23  GLUCOSE 145*  BUN 8  CREATININE 0.82  CALCIUM 9.0  AST 48*  ALT 98*  ALKPHOS 41  BILITOT 0.6     Urinalysis    Component Value Date/Time   COLORURINE YELLOW 07/11/2014 0140   APPEARANCEUR CLEAR 07/11/2014 0140   LABSPEC 1.003* 07/11/2014 0140   PHURINE 6.5 07/11/2014 0140   GLUCOSEU NEGATIVE 07/11/2014 0140   HGBUR NEGATIVE 07/11/2014 0140   BILIRUBINUR NEGATIVE 07/11/2014 0140   KETONESUR NEGATIVE 07/11/2014 0140   PROTEINUR NEGATIVE 07/11/2014 0140   UROBILINOGEN 0.2 07/11/2014 0140   NITRITE NEGATIVE 07/11/2014 0140   LEUKOCYTESUR NEGATIVE 07/11/2014 0140    Imaging results:   Ct Abdomen Pelvis W Contrast  07/11/2014   CLINICAL DATA:  Moderate constant low back pain beginning at 3 p.m. yesterday. Urinary frequency, headache, sore throat, fever. History of hypertension, ventral hernia and liver disease.  EXAM: CT ABDOMEN AND PELVIS WITH CONTRAST  TECHNIQUE: Multidetector CT imaging of the abdomen and pelvis was performed using the standard protocol following bolus administration of intravenous contrast.  CONTRAST:  OMNIPAQUE IOHEXOL 300 MG/ML  SOLN  COMPARISON:  None.  FINDINGS: LUNG BASES: Included  view of the lung bases demonstrate LEFT lower lobe atelectasis. Visualized heart and pericardium are unremarkable. Pectus excavatum  SOLID ORGANS: The spleen, gallbladder, pancreas and adrenal glands are unremarkable. The liver is diffusely hypodense consistent with hepatic steatosis and otherwise unremarkable.  GASTROINTESTINAL TRACT: The stomach, small and large bowel are normal in course and caliber without inflammatory changes. Normal retrocecal appendix. Moderate amount of retained large  bowel stool.  KIDNEYS/ URINARY TRACT: Kidneys are orthotopic, demonstrating symmetric enhancement. No nephrolithiasis, hydronephrosis or solid renal masses. Nonspecific LEFT perirenal nephric stranding. The unopacified ureters are normal in course and caliber. Urinary bladder is partially distended and unremarkable.  PERITONEUM/RETROPERITONEUM: Aortoiliac vessels are normal in course and caliber, trace calcific atherosclerosis. No lymphadenopathy by CT size criteria. Prostate is enlarged, 5.8 x 4.7 cm. No intraperitoneal free fluid nor free air.  SOFT TISSUE/OSSEOUS STRUCTURES: Non-suspicious. No acute fracture or malalignment of the lumbar spine, no advanced degenerative change for age. Scattered Schmorl's nodes.  IMPRESSION: Nonspecific LEFT perinephric stranding.  Prostatomegaly.  Moderate amount of retained large bowel stool without bowel obstruction.   Electronically Signed   By: Awilda Metro   On: 07/11/2014 04:01    My personal review of EKG: Sinus tach, No ST changes noted.   Assessment & Plan  Principal Problem:   Pyelonephritis Active Problems:   Anxiety   Sepsis   HTN (hypertension)   Elevated LFTs   Alcohol use  Sepsis secondary to pyelonephritis CT scan shows prostatomegaly which likely contributed to his pyelonephritis. We will start Flomax. Patient may need a urology referral on discharge.   Blood and urine cultures are pending. Ceftriaxone has been initiated. The patient's pulse rate  appears to be decreasing after receiving IV fluid boluses in the ER. We will continue IV fluids at 100 mL per hour.  Hypertension Despite his reports of recent high blood pressure his blood pressure is within normal range (relatively hypotensive) Will continue diltiazem, but hold Diovan-HCT.  Elevated LFTs  Alcohol use  Anxiety and depression Will continue his Klonopin and Effexor    DVT Prophylaxis: SCDs - given his level of activity and mild thrombocytopenia  AM Labs Ordered, also please review Full Orders  Family Communication:     Code Status:    Condition:    Time spent in minutes : 60   Algis Downs,  PA-C on 07/11/2014 at 9:12 AM  Between 7am to 7pm - Pager - [phone]  After 7pm go to [email or web address] - password [email or web address]  And look for the night coverage person covering me after hours  [facility]

## 2014-07-11 NOTE — Progress Notes (Signed)
Paged Dr Rito EhrlichKrishnan, pt new admit was NPO, then heart healthy, now NPO, clarification does pt need to be NPO.

## 2014-07-11 NOTE — H&P (Signed)
Expand All Collapse All   [facility] History and Physical    Stephen Klein, is a 39 y.o. male MRN: [id number] DOB - Mar 27, 1976  Admit Date - 07/11/2014  Outpatient Primary MD for the patient is Stephen Rily, MD  With History of -  Past Medical History  Diagnosis Date  . Depression   . Anxiety   . Hypertension   . Hypercholesteremia   . Liver disease      Past Surgical History  Procedure Laterality Date  . Vasectomy      in for   Chief Complaint  Patient presents with  . Back Pain  . Dysuria  . 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 He presents to the emergency department with lower back pain and sore throat that started at approximately 3 PM yesterday. The patient reports he was feeling fine prior to the start of the symptoms. Since then he has had urinary frequency, nausea, headache, fever and chills, sore throat, and back pain that spans across his lower back. He complains of diffuse myalgias. He tells me that ever his last couple of primary care visits he and his PCP have been working to decrease his high blood pressure.  Workup in the ER included a CT abdomen pelvis that showed left perinephritic stranding, lactic acid of 2.01, sodium 131, WBC 13.4. U/A appears negative for infection. Vital signs: Temperature 102.3, pulse 128, respirations 28.  Review of Systems  In addition to the HPI above,  No Headache, No changes with Vision or hearing, No problems swallowing food or Liquids, No Chest pain, Cough or Shortness of Breath, No Blood in stool or Urine, No new skin rashes or bruises, No new joints pains-aches,    Social History History  Substance Use Topics  . Smoking  status: Current Every Day Smoker -- 1.00 packs/day for 8 years  . Smokeless tobacco: Former Neurosurgeon  . Alcohol Use: 11.4 oz/week    15 Standard drinks or equivalent, 4 Cans of beer per week    Family History No family history on file. his father died in a motor vehicle accident. His mother is still living and has hypertension.  Prior to Admission medications   Medication Sig Start Date End Date Taking? Authorizing Provider  clonazePAM (KLONOPIN) 0.5 MG tablet Take 0.5 mg by mouth 3 (three) times daily.   Yes Historical Provider, MD  diltiazem (DILACOR XR) 240 MG 24 hr capsule Take 240 mg by mouth daily.   Yes Historical Provider, MD  valsartan-hydrochlorothiazide (DIOVAN-HCT) 160-12.5 MG per tablet Take 1 tablet by mouth daily.   Yes Historical Provider, MD  venlafaxine XR (EFFEXOR-XR) 150 MG 24 hr capsule Take 1 capsule (150 mg total) by mouth daily. 03/25/12  Yes Tonye Pearson, MD  diclofenac sodium (VOLTAREN) 1 % GEL Apply 2 g topically 2 (two) times daily.    Historical Provider, MD  HYDROcodone-acetaminophen (NORCO) 10-325 MG per tablet Take 1 tablet by mouth every 6 (six) hours as needed for pain. Patient not taking: Reported on 07/11/2014 03/18/13   Gerhard Munch, MD  Multiple Vitamin (MULTIVITAMIN WITH MINERALS) TABS tablet Take 1 tablet by mouth daily.    Historical Provider, MD    Allergies  Allergen Reactions  . Sulfa Antibiotics Other (See Comments)    childhood    Physical Exam  Vitals  Blood pressure 127/68, pulse  96, temperature 98.3 F (36.8 C), temperature source Oral, resp. rate 20, height  (1.88 m), weight 91 kg (200 lb 9.9 oz), SpO2 97 %.   General: Well-developed pale, male, lying in bed in NAD, appears older than stated age.  Psych: Normal affect and insight, Not Suicidal or Homicidal, Awake Alert, Oriented X 3.  Neuro: No F.N deficits, ALL C.Nerves Intact, Strength 5/5  all 4 extremities, Sensation intact all 4 extremities.  ENT: Ears and Eyes appear Normal, Conjunctivae clear, PER. Moist oral mucosa without erythema or exudates.  Neck: Supple, No lymphadenopathy appreciated  Respiratory: Symmetrical chest wall movement, Good air movement bilaterally, CTAB.  Cardiac: RRR, No Murmurs, no LE edema noted, no JVD.   Abdomen: Positive bowel sounds, Soft, Non tender, Non distended, No masses appreciated, positive CVA tenderness. (Interestingly on the right)  Skin: No Cyanosis, Normal Skin Turgor, No Skin Rash or Bruise.  Extremities: Able to move all 4. 5/5 strength in each, no effusions.  Data Review  CBC  Last Labs      Recent Labs Lab 07/11/14 0058  WBC 13.4*  HGB 15.0  HCT 43.0  PLT 143*  MCV 84.5  MCH 29.5  MCHC 34.9  RDW 12.7  LYMPHSABS 1.4  MONOABS 1.2*  EOSABS 0.1  BASOSABS 0.0      Chemistries   Last Labs      Recent Labs Lab 07/11/14 0058  NA 131*  K 3.8  CL 98  CO2 23  GLUCOSE 145*  BUN 8  CREATININE 0.82  CALCIUM 9.0  AST 48*  ALT 98*  ALKPHOS 41  BILITOT 0.6       Urinalysis  Labs (Brief)       Component Value Date/Time   COLORURINE YELLOW 07/11/2014 0140   APPEARANCEUR CLEAR 07/11/2014 0140   LABSPEC 1.003* 07/11/2014 0140   PHURINE 6.5 07/11/2014 0140   GLUCOSEU NEGATIVE 07/11/2014 0140   HGBUR NEGATIVE 07/11/2014 0140   BILIRUBINUR NEGATIVE 07/11/2014 0140   KETONESUR NEGATIVE 07/11/2014 0140   PROTEINUR NEGATIVE 07/11/2014 0140   UROBILINOGEN 0.2 07/11/2014 0140   NITRITE NEGATIVE 07/11/2014 0140   LEUKOCYTESUR NEGATIVE 07/11/2014 0140      Imaging results:    Imaging Results    Ct Abdomen Pelvis W Contrast  07/11/2014 CLINICAL DATA: Moderate constant low back pain beginning at 3 p.m. yesterday. Urinary frequency, headache, sore throat, fever. History of hypertension,  ventral hernia and liver disease. EXAM: CT ABDOMEN AND PELVIS WITH CONTRAST TECHNIQUE: Multidetector CT imaging of the abdomen and pelvis was performed using the standard protocol following bolus administration of intravenous contrast. CONTRAST: OMNIPAQUE IOHEXOL 300 MG/ML SOLN COMPARISON: None. FINDINGS: LUNG BASES: Included view of the lung bases demonstrate LEFT lower lobe atelectasis. Visualized heart and pericardium are unremarkable. Pectus excavatum SOLID ORGANS: The spleen, gallbladder, pancreas and adrenal glands are unremarkable. The liver is diffusely hypodense consistent with hepatic steatosis and otherwise unremarkable. GASTROINTESTINAL TRACT: The stomach, small and large bowel are normal in course and caliber without inflammatory changes. Normal retrocecal appendix. Moderate amount of retained large bowel stool. KIDNEYS/ URINARY TRACT: Kidneys are orthotopic, demonstrating symmetric enhancement. No nephrolithiasis, hydronephrosis or solid renal masses. Nonspecific LEFT perirenal nephric stranding. The unopacified ureters are normal in course and caliber. Urinary bladder is partially distended and unremarkable. PERITONEUM/RETROPERITONEUM: Aortoiliac vessels are normal in course and caliber, trace calcific atherosclerosis. No lymphadenopathy by CT size criteria. Prostate is enlarged, 5.8 x 4.7 cm. No intraperitoneal free fluid nor free air. SOFT TISSUE/OSSEOUS STRUCTURES: Non-suspicious.  No acute fracture or malalignment of the lumbar spine, no advanced degenerative change for age. Scattered Schmorl's nodes. IMPRESSION: Nonspecific LEFT perinephric stranding. Prostatomegaly. Moderate amount of retained large bowel stool without bowel obstruction. Electronically Signed By: Awilda Metroourtnay Bloomer On: 07/11/2014 04:01     My personal review of EKG: Sinus tach, No ST changes noted.   Assessment & Plan  Principal Problem:  Pyelonephritis Active Problems:  Anxiety   Sepsis  HTN (hypertension)  Elevated LFTs  Alcohol use  Sepsis secondary to pyelonephritis Sepsis evidenced by T 102, tachycardia, and WBC of 13.4 CT scan shows prostatomegaly which likely contributed to his pyelonephritis. We will start Flomax. Patient may need a urology referral on discharge.  Blood and urine cultures are pending. Ceftriaxone has been initiated. The patient's pulse rate appears to be decreasing after receiving IV fluid boluses in the ER. We will continue IV fluids at 100 mL per hour.  Hypertension Despite his reports of recent high blood pressure his blood pressure is within normal range (relatively hypotensive) Will continue diltiazem, but hold Diovan-HCT.  Sore Throat Will swab for strep.  Please follow results. Supportive care.  Elevated LFTs / fatty liver Being followed outpatient.  Suspect they are due to alcohol use but will check an acute hepatitis panel. CT also noted fatty liver.   Alcohol use Patient reports a few beers on the weekend.  Uncertain if this is true. Given LFTs and platelets / CT scan results, he should probably stop alcohol all together. Social work consult.   CIWA protocol - just in case.  Anxiety and depression Will continue his Klonopin and Effexor  DVT Prophylaxis: SCDs - given his level of activity and mild thrombocytopenia  AM Labs Ordered, also please review Full Orders  Family Communication: none - patient is alert and understands his plan of care.  Code Status: full  Condition: stable.  Time spent in minutes : [address]   Dezi Brauner [place], PA-C on 07/11/2014 at 9:12 AM  Between 7am to 7pm - Pager - [phone]  After 7pm go to [email or web address] - password [email or web address]  And look for the night coverage person covering me after hours  [facility]

## 2014-07-11 NOTE — Progress Notes (Signed)
Strep positive paged Algis DownsMarianne York, [place].

## 2014-07-11 NOTE — Progress Notes (Signed)
Rapid strep screen sent to lab

## 2014-07-12 DIAGNOSIS — F419 Anxiety disorder, unspecified: Secondary | ICD-10-CM

## 2014-07-12 DIAGNOSIS — R7989 Other specified abnormal findings of blood chemistry: Secondary | ICD-10-CM

## 2014-07-12 LAB — HEPATITIS PANEL, ACUTE
HCV Ab: NEGATIVE
HEP A IGM: NONREACTIVE
HEP B S AG: NEGATIVE
Hep B C IgM: NONREACTIVE

## 2014-07-12 LAB — COMPREHENSIVE METABOLIC PANEL
ALT: 66 U/L — ABNORMAL HIGH (ref 0–53)
ANION GAP: 9 (ref 5–15)
AST: 29 U/L (ref 0–37)
Albumin: 3.3 g/dL — ABNORMAL LOW (ref 3.5–5.2)
Alkaline Phosphatase: 39 U/L (ref 39–117)
BILIRUBIN TOTAL: 0.6 mg/dL (ref 0.3–1.2)
CALCIUM: 9 mg/dL (ref 8.4–10.5)
CO2: 24 mmol/L (ref 19–32)
Chloride: 104 mmol/L (ref 96–112)
Creatinine, Ser: 0.84 mg/dL (ref 0.50–1.35)
GFR calc Af Amer: >90 mL/min (ref >90)
GFR calc non Af Amer: >90 mL/min (ref >90)
GLUCOSE: 115 mg/dL — AB (ref 70–99)
Potassium: 3.8 mmol/L (ref 3.5–5.1)
Sodium: 137 mmol/L (ref 135–145)
Total Protein: 7 g/dL (ref 6.0–8.3)

## 2014-07-12 LAB — URINE CULTURE
CULTURE: NO GROWTH
Colony Count: NO GROWTH

## 2014-07-12 LAB — CBC
HCT: 38.3 % — ABNORMAL LOW (ref 39.0–52.0)
Hemoglobin: 12.9 g/dL — ABNORMAL LOW (ref 13.0–17.0)
MCH: 28.2 pg (ref 26.0–34.0)
MCHC: 33.7 g/dL (ref 30.0–36.0)
MCV: 83.6 fL (ref 78.0–100.0)
PLATELETS: 120 10*3/uL — AB (ref 150–400)
RBC: 4.58 MIL/uL (ref 4.22–5.81)
RDW: 13 % (ref 11.5–15.5)
WBC: 13.8 10*3/uL — ABNORMAL HIGH (ref 4.0–10.5)

## 2014-07-12 NOTE — Progress Notes (Signed)
[facility] PROGRESS NOTE  Stephen MaskerDavid Hannan RUE:[id number] DOB: 1976-01-30 DOA: 07/11/2014 PCP: [name],[name], MD  Assessment/Plan: Sepsis secondary to pyelonephritis ==CT scan shows prostatomegaly  ddx prostatitis vs BPH == On  Flomax.  ==Will need a urology referral on discharge considering UTI, prostatomegaly.  ==Blood and urine cultures are pending. ==On Ceftriaxone 2/13  Enlarged prostate Rectal exam tender to palpate Treat as prostatitis Ct abd and pelvis does not show abscess.   Pyelonephritis Cont with rocephin and f/u with urine cx  Constipation on CT abd and pelvis ==miralax - resolved  Hypertension ==Continue diltiazem, == but holding Diovan-HCT.  Elevated LFTs ==freom sepsis and ETOH  Alcohol use ==councelled  Anxiety and depression Will continue his Klonopin and Effexor  Code Status: Full Family Communication: Patient Disposition Plan: pending improvement and f/u culture   Consultants:  None  Procedures:  CT abd and pelvis ==Nonspecific LEFT perinephric stranding. Prostatomegaly. Moderate amount of retained large bowel stool without bowel obstruction.  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 He presents to the emergency department with lower back pain and sore throat that started at approximately 3 PM yesterday. The patient reports he was feeling fine prior to the start of the symptoms. Since then he has had urinary frequency, nausea, headache, fever and chills, sore throat, and back pain that spans across his lower back. He complains of diffuse myalgias. He tells me that ever his last couple of primary care visits he and his PCP have been working to decrease his high blood pressure.  Workup in the ER included a CT abdomen pelvis that showed left  perinephritic stranding, lactic acid of 2.01, sodium 131, WBC 13.4. U/A appears negative for infection. Vital signs: Temperature 102.3, pulse 128, respirations 28.   Objective: Filed Vitals:   07/12/14 0513  BP: 124/72  Pulse: 106  Temp: 99.6 F (37.6 C)  Resp: 18    Intake/Output Summary (Last 24 hours) at 07/12/14 1208 Last data filed at 07/12/14 0848  Gross per 24 hour  Intake 1441.67 ml  Output    400 ml  Net 1041.67 ml   Filed Weights   07/11/14 0039 07/11/14 0700 07/11/14 1956  Weight: 90.719 kg (200 lb) 91 kg (200 lb 9.9 oz) 96.9 kg (213 lb 10 oz)    Exam:   General:  Not in distress  Cardiovascular: S1, S2   Respiratory: b/l clear  Abdomen: soft NT, ND  Musculoskeletal: good ROM   Data Reviewed: Basic Metabolic Panel:  Recent Labs Lab 07/11/14 0058 07/12/14 0710  NA 131* 137  K 3.8 3.8  CL 98 104  CO2 23 24  GLUCOSE 145* 115*  BUN 8 <5*  CREATININE 0.82 0.84  CALCIUM 9.0 9.0   Liver Function Tests:  Recent Labs Lab 07/11/14 0058 07/12/14 0710  AST 48* 29  ALT 98* 66*  ALKPHOS 41 39  BILITOT 0.6 0.6  PROT 7.4 7.0  ALBUMIN 4.4 3.3*   No results for input(s): LIPASE, AMYLASE in the last 168 hours. No results for input(s): AMMONIA in the last 168 hours. CBC:  Recent Labs Lab 07/11/14 0058 07/12/14 0710  WBC 13.4* 13.8*  NEUTROABS 10.6*  --   HGB 15.0 12.9*  HCT 43.0 38.3*  MCV 84.5 83.6  PLT 143* 120*   Cardiac Enzymes: No results for input(s): CKTOTAL, CKMB, CKMBINDEX, TROPONINI in the last 168  hours. BNP (last 3 results) No results for input(s): BNP in the last 8760 hours.  ProBNP (last 3 results) No results for input(s): PROBNP in the last 8760 hours.  CBG: No results for input(s): GLUCAP in the last 168 hours.  Recent Results (from the past 240 hour(s))  Rapid strep screen     Status: Abnormal   Collection Time: 07/11/14  2:59 PM  Result Value Ref Range Status   Streptococcus, Group A Screen (Direct) POSITIVE (A)  NEGATIVE Final     Studies: Ct Abdomen Pelvis W Contrast  07/11/2014   CLINICAL DATA:  Moderate constant low back pain beginning at 3 p.m. yesterday. Urinary frequency, headache, sore throat, fever. History of hypertension, ventral hernia and liver disease.  EXAM: CT ABDOMEN AND PELVIS WITH CONTRAST  TECHNIQUE: Multidetector CT imaging of the abdomen and pelvis was performed using the standard protocol following bolus administration of intravenous contrast.  CONTRAST:  OMNIPAQUE IOHEXOL 300 MG/ML  SOLN  COMPARISON:  None.  FINDINGS: LUNG BASES: Included view of the lung bases demonstrate LEFT lower lobe atelectasis. Visualized heart and pericardium are unremarkable. Pectus excavatum  SOLID ORGANS: The spleen, gallbladder, pancreas and adrenal glands are unremarkable. The liver is diffusely hypodense consistent with hepatic steatosis and otherwise unremarkable.  GASTROINTESTINAL TRACT: The stomach, small and large bowel are normal in course and caliber without inflammatory changes. Normal retrocecal appendix. Moderate amount of retained large bowel stool.  KIDNEYS/ URINARY TRACT: Kidneys are orthotopic, demonstrating symmetric enhancement. No nephrolithiasis, hydronephrosis or solid renal masses. Nonspecific LEFT perirenal nephric stranding. The unopacified ureters are normal in course and caliber. Urinary bladder is partially distended and unremarkable.  PERITONEUM/RETROPERITONEUM: Aortoiliac vessels are normal in course and caliber, trace calcific atherosclerosis. No lymphadenopathy by CT size criteria. Prostate is enlarged, 5.8 x 4.7 cm. No intraperitoneal free fluid nor free air.  SOFT TISSUE/OSSEOUS STRUCTURES: Non-suspicious. No acute fracture or malalignment of the lumbar spine, no advanced degenerative change for age. Scattered Schmorl's nodes.  IMPRESSION: Nonspecific LEFT perinephric stranding.  Prostatomegaly.  Moderate amount of retained large bowel stool without bowel obstruction.    Electronically Signed   By: Awilda Metro   On: 07/11/2014 04:01    Scheduled Meds: . aspirin EC  81 mg Oral Daily  . cefTRIAXone (ROCEPHIN)  IV  1 g Intravenous Q24H  . clonazePAM  0.5 mg Oral TID  . diltiazem  240 mg Oral Daily  . docusate sodium  100 mg Oral BID  . folic acid  1 mg Oral Daily  . magic mouthwash  5 mL Oral TID  . multivitamin with minerals  1 tablet Oral Daily  . polyethylene glycol  17 g Oral Daily  . tamsulosin  0.4 mg Oral Daily  . thiamine  100 mg Oral Daily   Or  . thiamine  100 mg Intravenous Daily  . venlafaxine XR  150 mg Oral Daily   Continuous Infusions: . sodium chloride 100 mL/hr at 07/12/14 1133    Principal Problem:   Pyelonephritis Active Problems:   Anxiety   Sepsis   HTN (hypertension)   Elevated LFTs   Alcohol use    Time spent: 40    Malvika Tung  [facility] Pager [phone] If 7PM-7AM, please contact night-coverage at [email or web address], password [facility] 07/12/2014, 12:08 PM  LOS: 1 day

## 2014-07-12 NOTE — Progress Notes (Signed)
UR Completed.  [phone]  

## 2014-07-13 ENCOUNTER — Inpatient Hospital Stay (HOSPITAL_COMMUNITY): Payer: BLUE CROSS/BLUE SHIELD

## 2014-07-13 NOTE — Progress Notes (Addendum)
Patient ID: Stephen Klein, male   DOB: 1976/03/16, 39 y.o.   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 He presented to [facility] ED with sore throat and difficulty swallowing that suddenly started one day prior to this admission associated with subjective fevers, chills, poor oral intake. In addition, he has been having problems with urinary urgency, frequency, mostly left sided flank that has since resolved.   Workup in the ER included a CT abdomen pelvis that showed left perinephritic stranding, lactic acid of 2.01, sodium 131, WBC 13.4. U/A appears negative for an infection. Vital signs: Temperature 102.3, pulse 128, respirations 28.  Assessment/Plan:    Principal Problem:   Sepsis secondary to Pyelonephritis and strep throat - sepsis criteria met on admission: T 100.30F, WBC 13 K, HR 108 bpm, lactic acid > 2 - source pyelonephritis and strep throat  - pt clinically stable this AM, Tmax still 100.7 F over the past 24 hours and WBC still in 13K - continue Rocephin IV day #3 - continue supportive care with IVF, analgesia as needed - encouraged PO intake and soft food if still having pain with swallowing  - given persistent fevers and leukocytosis, will ask for CXR to rule out underlying PNA (pt has cough this AM) Active Problems:   Left pyelonephritis - ABX as noted above - continue IVF   Strep throat with swallen anterior cervical lymph nodes - LN swelling bilaterally and TTP likely from strep infection itself - will also ask for HIV test to be done with next blood draw - rocephin will be adequate    Anxiety - stable, continue Clonazepam    HTN (hypertension) - reasonable inpatient control - continue Cardizem     Elevated LFTs, ALT > AST - hepatitis panel unremarkable - LFT's trending down   Alcohol use - no sings of withdrawal, continue to keep on CIWA    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  Family Communication:  plan of care discussed with the patient and wife at bedside  Disposition Plan: Home when stable.   IV access:  Peripheral IV  Procedures and diagnostic studies:    Ct Abdomen Pelvis W Contrast  07/11/2014  Nonspecific LEFT perinephric stranding.  Prostatomegaly.  Moderate amount of retained large bowel stool without bowel obstruction.    Medical Consultants:  None   Other Consultants:  None   IAnti-Infectives:   Rocephin 2/13 -->  Faye Ramsay, MD  [facility] Pager [phone]  If 7PM-7AM, please contact night-coverage [email or web address] Password [facility] 07/13/2014, 12:26 PM   LOS: 2 days   HPI/Subjective: No events overnight.   Objective: Filed Vitals:   07/12/14 2023 07/12/14 2253 07/13/14 0531 07/13/14 0943  BP: 120/78  117/71 123/73  Pulse: 108  91 91  Temp: 100.5 F (38.1 C) 99.5 F (37.5 C) 99 F (37.2 C) 98.2 F (36.8 C)  TempSrc: Oral Oral Oral Oral  Resp: 17  18 17   Height:      Weight: 97.2 kg (214 lb 4.6 oz)     SpO2:  95%  97% 95%    Intake/Output Summary (Last 24 hours) at 07/13/14 1226 Last data filed at 07/13/14 0955  Gross per 24 hour  Intake    480 ml  Output      0 ml  Net    480 ml    Exam:   General:  Pt is alert, follows commands appropriately, not in acute distress, anterior cervical LN swelling, 1 cm in diameter and TTP  Cardiovascular: Regular rate and rhythm, no rubs, no gallops  Respiratory: Clear to auscultation bilaterally, no wheezing, no crackles, no rhonchi  Abdomen: Soft, non tender, non distended, bowel sounds  present, no guarding  Extremities: No edema, pulses DP and PT palpable bilaterally  Neuro: Grossly nonfocal  Data Reviewed: Basic Metabolic Panel:  Recent Labs Lab 07/11/14 0058 07/12/14 0710  NA 131* 137  K 3.8 3.8  CL 98 104  CO2 23 24  GLUCOSE 145* 115*  BUN 8 <5*  CREATININE 0.82 0.84  CALCIUM 9.0 9.0   Liver Function Tests:  Recent Labs Lab 07/11/14 0058 07/12/14 0710  AST 48* 29  ALT 98* 66*  ALKPHOS 41 39  BILITOT 0.6 0.6  PROT 7.4 7.0  ALBUMIN 4.4 3.3*   CBC:  Recent Labs Lab 07/11/14 0058 07/12/14 0710  WBC 13.4* 13.8*  NEUTROABS 10.6*  --   HGB 15.0 12.9*  HCT 43.0 38.3*  MCV 84.5 83.6  PLT 143* 120*   Recent Results (from the past 240 hour(s))  Urine culture     Status: None   Collection Time: 07/11/14  1:40 AM  Result Value Ref Range Status   Specimen Description URINE, CLEAN CATCH  Final   Special Requests NONE  Final   Colony Count NO GROWTH Performed at [facility]   Final   Culture NO GROWTH Performed at [facility]   Final   Report Status 07/12/2014 FINAL  Final  Culture, blood (routine x 2)     Status: None (Preliminary result)   Collection Time: 07/11/14  4:30 AM  Result Value Ref Range Status   Specimen Description BLOOD RIGHT ARM  Final   Special Requests BOTTLES DRAWN AEROBIC AND ANAEROBIC 10CC EACH  Final   Culture   Final           BLOOD CULTURE RECEIVED NO GROWTH TO DATE CULTURE WILL BE HELD FOR 5 DAYS BEFORE ISSUING A FINAL NEGATIVE REPORT Note: Culture results may be compromised due to an excessive volume of blood received in culture bottles. Performed at [facility]    Report Status PENDING  Incomplete  Culture, blood (routine x 2)     Status: None (Preliminary result)   Collection Time: 07/11/14  4:35 AM  Result Value Ref Range Status   Specimen Description BLOOD RIGHT HAND  Final   Special Requests BOTTLES DRAWN AEROBIC ONLY 10CC  Final   Culture   Final           BLOOD CULTURE  RECEIVED NO GROWTH TO DATE CULTURE WILL BE HELD FOR 5 DAYS BEFORE ISSUING A FINAL NEGATIVE REPORT Note: Culture results may be compromised due to an excessive volume of blood received in culture bottles. Performed at [facility]    Report Status PENDING  Incomplete  Rapid strep screen     Status: Abnormal   Collection Time: 07/11/14  2:59 PM  Result Value Ref Range Status   Streptococcus, Group A Screen (Direct) POSITIVE (A) NEGATIVE Final     Scheduled  Meds: . aspirin EC  81 mg Oral Daily  . cefTRIAXone (ROCEPHIN)  IV  1 g Intravenous Q24H  . clonazePAM  0.5 mg Oral TID  . diltiazem  240 mg Oral Daily  . docusate sodium  100 mg Oral BID  . folic acid  1 mg Oral Daily  . magic mouthwash  5 mL Oral TID  . multivitamin with minerals  1 tablet Oral Daily  . polyethylene glycol  17 g Oral Daily  . tamsulosin  0.4 mg Oral Daily  . thiamine  100 mg Oral Daily   Or  . thiamine  100 mg Intravenous Daily  . venlafaxine XR  150 mg Oral Daily   Continuous Infusions: . sodium chloride 100 mL/hr at 07/13/14 0957

## 2014-07-14 LAB — CBC
HCT: 42 % (ref 39.0–52.0)
Hemoglobin: 14.2 g/dL (ref 13.0–17.0)
MCH: 28.5 pg (ref 26.0–34.0)
MCHC: 33.8 g/dL (ref 30.0–36.0)
MCV: 84.3 fL (ref 78.0–100.0)
PLATELETS: 141 10*3/uL — AB (ref 150–400)
RBC: 4.98 MIL/uL (ref 4.22–5.81)
RDW: 12.5 % (ref 11.5–15.5)
WBC: 6.3 10*3/uL (ref 4.0–10.5)

## 2014-07-14 LAB — BASIC METABOLIC PANEL
Anion gap: 12 (ref 5–15)
BUN: 8 mg/dL (ref 6–23)
CHLORIDE: 104 mmol/L (ref 96–112)
CO2: 21 mmol/L (ref 19–32)
Calcium: 8.8 mg/dL (ref 8.4–10.5)
Creatinine, Ser: 0.78 mg/dL (ref 0.50–1.35)
Glucose, Bld: 113 mg/dL — ABNORMAL HIGH (ref 70–99)
Potassium: 3.9 mmol/L (ref 3.5–5.1)
SODIUM: 137 mmol/L (ref 135–145)

## 2014-07-14 MED ORDER — TRAMADOL HCL 50 MG PO TABS: 50.0000 mg | ORAL_TABLET | Freq: Four times a day (QID) | ORAL | Status: AC | PRN

## 2014-07-14 MED ORDER — CLONAZEPAM 0.5 MG PO TABS: 0.5000 mg | ORAL_TABLET | Freq: Three times a day (TID) | ORAL | Status: AC

## 2014-07-14 MED ORDER — ONDANSETRON HCL 4 MG PO TABS: 4.0000 mg | ORAL_TABLET | Freq: Four times a day (QID) | ORAL | Status: DC | PRN

## 2014-07-14 MED ORDER — AMOXICILLIN-POT CLAVULANATE 875-125 MG PO TABS
1.0000 | ORAL_TABLET | Freq: Two times a day (BID) | ORAL | Status: DC
Start: 2014-07-14 — End: 2015-08-03

## 2014-07-14 NOTE — Progress Notes (Signed)
Discharge instructions reviewed with pt; allowing time for questions. Pt verbalized understanding. Prescriptions given to pt.  IV removed without issue. Pt leaving unit ambulatory by pt request.

## 2014-07-14 NOTE — Discharge Summary (Signed)
Physician Discharge Summary  Stephen Klein KGU:[id number] DOB: [birth year] DOA: 07/11/2014  PCP: Andria Frames, MD  Admit date: 07/11/2014 Discharge date: 07/14/2014  Recommendations for Outpatient Follow-up:  1. 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 He presented to [facility] ED with sore throat and difficulty swallowing that suddenly started one day prior to this admission associated with subjective fevers, chills, poor oral intake. In addition, he has been having problems with urinary urgency, frequency, mostly left sided flank that has since resolved.   Workup in the ER included a CT abdomen pelvis that showed left perinephritic stranding, lactic acid of 2.01, sodium 131, WBC 13.4. U/A appears negative for an infection. Vital signs: Temperature 102.3, pulse 128, respirations 28.  Assessment/Plan:    Principal Problem:  Sepsis secondary to Pyelonephritis and strep throat, CAP from strep pneumo  - sepsis criteria met on admission: T 100.59F, WBC 13 K, HR 108 bpm, lactic acid > 2 - source pyelonephritis and strep throat, CAP - pt clinically stable this AM, Tmax still 100.7 F over the past 24 hours and WBC still in 13K - continue Rocephin IV day #4 and transition to oral Augmentin upon discharge to complete full therapy  - encouraged PO intake and soft food if still having pain with swallowing  Active Problems:  Left pyelonephritis - ABX as noted above   CAP - strep pneumo, noted on CXR and with positive strep  - Augmentin upon  discharge   Strep throat with swallen anterior cervical lymph nodes - LN swelling bilaterally and TTP likely from strep infection itself - better this am   Anxiety - stable, continue Clonazepam   HTN (hypertension) - reasonable inpatient control - continue Cardizem   Elevated LFTs, ALT > AST - hepatitis panel unremarkable - LFT's trending down  Alcohol use - no sings of withdrawal, continue to keep on CIWA   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  Family Communication: plan of care discussed with the patient and wife at bedside  Disposition Plan: Home   IV access:  Peripheral IV  Procedures and diagnostic studies:   Ct Abdomen Pelvis W Contrast 07/11/2014 Nonspecific LEFT perinephric stranding. Prostatomegaly. Moderate amount of retained large bowel stool without bowel obstruction.   Medical Consultants:  None   Other Consultants:  None   IAnti-Infectives:   Rocephin 2/13 --> 2/16 Augmentin 2/16 --> continue upon discharge        Discharge Exam: Filed Vitals:   07/14/14 0500  BP: 121/81  Pulse: 74  Temp: 97.6 F (36.4 C)  Resp: 18   Filed Vitals:   07/13/14 0943 07/13/14 1853 07/13/14 2100 07/14/14 0500  BP: 123/73 144/78 126/82 121/81  Pulse: 91 81 75 74  Temp: 98.2 F (36.8 C) 98.3 F (36.8 C) 98.2 F (36.8 C) 97.6 F (36.4 C)  TempSrc: Oral Oral Oral Oral  Resp: 17  18 18 18   Height:      Weight:   97.1 kg (214 lb 1.1 oz)   SpO2: 95% 100% 97% 98%    General: Pt is alert, follows commands appropriately, not in acute distress Cardiovascular: Regular rate and rhythm, S1/S2 +, no murmurs, no rubs, no gallops Respiratory: Clear to auscultation bilaterally, no wheezing, no crackles, no rhonchi Abdominal: Soft, non tender, non distended, bowel sounds +, no  guarding Extremities: no edema, no cyanosis, pulses palpable bilaterally DP and PT Neuro: Grossly nonfocal  Discharge Instructions  Discharge Instructions    Diet - low sodium heart healthy    Complete by:  As directed      Increase activity slowly    Complete by:  As directed             Medication List    STOP taking these medications        HYDROcodone-acetaminophen 10-325 MG per tablet  Commonly known as:  NORCO      TAKE these medications        amoxicillin-clavulanate 875-125 MG per tablet  Commonly known as:  AUGMENTIN  Take 1 tablet by mouth 2 (two) times daily.     clonazePAM 0.5 MG tablet  Commonly known as:  KLONOPIN  Take 1 tablet (0.5 mg total) by mouth 3 (three) times daily.     diclofenac sodium 1 % Gel  Commonly known as:  VOLTAREN  Apply 2 g topically 2 (two) times daily.     diltiazem 240 MG 24 hr capsule  Commonly known as:  DILACOR XR  Take 240 mg by mouth daily.     multivitamin with minerals Tabs tablet  Take 1 tablet by mouth daily.     ondansetron 4 MG tablet  Commonly known as:  ZOFRAN  Take 1 tablet (4 mg total) by mouth every 6 (six) hours as needed for nausea.     traMADol 50 MG tablet  Commonly known as:  ULTRAM  Take 1 tablet (50 mg total) by mouth every 6 (six) hours as needed.     valsartan-hydrochlorothiazide 160-12.5 MG per tablet  Commonly known as:  DIOVAN-HCT  Take 1 tablet by mouth daily.     venlafaxine XR 150 MG 24 hr capsule  Commonly known as:  EFFEXOR-XR  Take 1 capsule (150 mg total) by mouth daily.           Follow-up Information    Follow up with Andria Frames, MD.   Specialty:  Family Medicine   Contact information:   [place] [id number] [phone]       Follow up with Faye Ramsay, MD.   Specialty:  Internal Medicine   Why:  As needed, call my cell [phone]   Contact information:   [address] [place] [address] [phone]         The results of significant diagnostics from this hospitalization (including imaging, microbiology, ancillary and laboratory) are listed below for reference.     Microbiology: Recent Results (from the past 240 hour(s))  Urine culture     Status: None   Collection Time: 07/11/14  1:40 AM  Result Value Ref Range Status   Specimen Description URINE, CLEAN CATCH  Final   Special Requests NONE  Final   Colony Count NO GROWTH Performed at [facility]   Final   Culture NO GROWTH Performed at [facility]   Final   Report Status 07/12/2014 FINAL  Final  Culture, blood (routine x 2)     Status: None (Preliminary result)   Collection Time: 07/11/14  4:30 AM  Result Value Ref Range Status   Specimen Description BLOOD RIGHT ARM  Final   Special Requests BOTTLES DRAWN AEROBIC AND ANAEROBIC 10CC EACH  Final   Culture   Final           BLOOD CULTURE RECEIVED NO GROWTH TO DATE CULTURE WILL BE HELD FOR 5 DAYS BEFORE ISSUING A FINAL NEGATIVE REPORT Note: Culture results may be compromised due to an excessive volume of blood received in culture bottles. Performed at [facility]    Report Status PENDING  Incomplete  Culture, blood (routine x 2)     Status: None (Preliminary result)   Collection Time: 07/11/14  4:35 AM  Result Value Ref Range Status   Specimen Description BLOOD RIGHT HAND  Final   Special Requests BOTTLES DRAWN AEROBIC ONLY 10CC  Final   Culture   Final           BLOOD CULTURE RECEIVED NO GROWTH TO DATE CULTURE WILL BE HELD FOR 5 DAYS BEFORE ISSUING A FINAL NEGATIVE REPORT Note: Culture results may be compromised due to an excessive volume of blood received in culture bottles. Performed at [facility]    Report Status PENDING  Incomplete  Rapid strep screen     Status: Abnormal   Collection Time: 07/11/14  2:59 PM  Result Value Ref Range Status   Streptococcus, Group A Screen (Direct) POSITIVE (A) NEGATIVE Final     Labs: Basic  Metabolic Panel:  Recent Labs Lab 07/11/14 0058 07/12/14 0710 07/14/14 0612  NA 131* 137 137  K 3.8 3.8 3.9  CL 98 104 104  CO2 23 24 21   GLUCOSE 145* 115* 113*  BUN 8 <5* 8  CREATININE 0.82 0.84 0.78  CALCIUM 9.0 9.0 8.8   Liver Function Tests:  Recent Labs Lab 07/11/14 0058 07/12/14 0710  AST 48* 29  ALT 98* 66*  ALKPHOS 41 39  BILITOT 0.6 0.6  PROT 7.4 7.0  ALBUMIN 4.4 3.3*   CBC:  Recent Labs Lab 07/11/14 0058 07/12/14 0710 07/14/14 0612  WBC 13.4* 13.8* 6.3  NEUTROABS 10.6*  --   --   HGB 15.0 12.9* 14.2  HCT 43.0 38.3* 42.0  MCV 84.5 83.6 84.3  PLT 143* 120* 141*    SIGNED: Time coordinating discharge: Over 30 minutes  Faye Ramsay, MD  [facility] 07/14/2014, 10:18 AM Pager [phone]  If 7PM-7AM, please contact night-coverage [email or web address] Password [email or web address]

## 2014-07-14 NOTE — Discharge Instructions (Signed)

## 2014-07-15 LAB — HIV ANTIBODY (ROUTINE TESTING W REFLEX): HIV Screen 4th Generation wRfx: NONREACTIVE

## 2014-07-17 LAB — CULTURE, BLOOD (ROUTINE X 2)
CULTURE: NO GROWTH
Culture: NO GROWTH

## 2015-08-03 ENCOUNTER — Encounter (HOSPITAL_BASED_OUTPATIENT_CLINIC_OR_DEPARTMENT_OTHER): Payer: Self-pay

## 2015-08-03 ENCOUNTER — Emergency Department (HOSPITAL_BASED_OUTPATIENT_CLINIC_OR_DEPARTMENT_OTHER): Payer: BLUE CROSS/BLUE SHIELD

## 2015-08-03 ENCOUNTER — Other Ambulatory Visit: Payer: Self-pay

## 2015-08-03 ENCOUNTER — Emergency Department (HOSPITAL_BASED_OUTPATIENT_CLINIC_OR_DEPARTMENT_OTHER)
Admission: EM | Admit: 2015-08-03 | Discharge: 2015-08-04 | Disposition: A | Discharge status: Home / Self Care | Payer: BLUE CROSS/BLUE SHIELD | Attending: Emergency Medicine | Admitting: Emergency Medicine

## 2015-08-03 DIAGNOSIS — Z8639 Personal history of other endocrine, nutritional and metabolic disease: Secondary | ICD-10-CM | POA: Diagnosis not present

## 2015-08-03 DIAGNOSIS — R000 Tachycardia, unspecified: Secondary | ICD-10-CM | POA: Insufficient documentation

## 2015-08-03 DIAGNOSIS — F419 Anxiety disorder, unspecified: Secondary | ICD-10-CM | POA: Insufficient documentation

## 2015-08-03 DIAGNOSIS — Z79899 Other long term (current) drug therapy: Secondary | ICD-10-CM | POA: Diagnosis not present

## 2015-08-03 DIAGNOSIS — R079 Chest pain, unspecified: Secondary | ICD-10-CM | POA: Diagnosis not present

## 2015-08-03 DIAGNOSIS — R1084 Generalized abdominal pain: Secondary | ICD-10-CM | POA: Diagnosis not present

## 2015-08-03 DIAGNOSIS — R197 Diarrhea, unspecified: Secondary | ICD-10-CM | POA: Diagnosis not present

## 2015-08-03 DIAGNOSIS — F329 Major depressive disorder, single episode, unspecified: Secondary | ICD-10-CM | POA: Insufficient documentation

## 2015-08-03 DIAGNOSIS — Z8719 Personal history of other diseases of the digestive system: Secondary | ICD-10-CM | POA: Insufficient documentation

## 2015-08-03 DIAGNOSIS — F17210 Nicotine dependence, cigarettes, uncomplicated: Secondary | ICD-10-CM | POA: Diagnosis not present

## 2015-08-03 DIAGNOSIS — I10 Essential (primary) hypertension: Secondary | ICD-10-CM | POA: Diagnosis not present

## 2015-08-03 LAB — COMPREHENSIVE METABOLIC PANEL
ALBUMIN: 5.8 g/dL — AB (ref 3.5–5.0)
ALK PHOS: 39 U/L (ref 38–126)
ALT: 174 U/L — AB (ref 17–63)
ANION GAP: 12 (ref 5–15)
AST: 98 U/L — ABNORMAL HIGH (ref 15–41)
BUN: 12 mg/dL (ref 6–20)
CALCIUM: 9.9 mg/dL (ref 8.9–10.3)
CO2: 21 mmol/L — AB (ref 22–32)
CREATININE: 1.02 mg/dL (ref 0.61–1.24)
Chloride: 101 mmol/L (ref 101–111)
GFR calc Af Amer: >60 mL/min (ref >60)
GFR calc non Af Amer: >60 mL/min (ref >60)
GLUCOSE: 143 mg/dL — AB (ref 65–99)
Potassium: 4.8 mmol/L (ref 3.5–5.1)
SODIUM: 134 mmol/L — AB (ref 135–145)
Total Bilirubin: 1 mg/dL (ref 0.3–1.2)
Total Protein: 9.6 g/dL — ABNORMAL HIGH (ref 6.5–8.1)

## 2015-08-03 LAB — CBC WITH DIFFERENTIAL/PLATELET
BASOS PCT: 0 %
Basophils Absolute: 0 10*3/uL (ref 0.0–0.1)
EOS PCT: 1 %
Eosinophils Absolute: 0.1 10*3/uL (ref 0.0–0.7)
HEMATOCRIT: 48.6 % (ref 39.0–52.0)
HEMOGLOBIN: 16.9 g/dL (ref 13.0–17.0)
Lymphocytes Relative: 11 %
Lymphs Abs: 1.2 10*3/uL (ref 0.7–4.0)
MCH: 28.9 pg (ref 26.0–34.0)
MCHC: 34.8 g/dL (ref 30.0–36.0)
MCV: 83.1 fL (ref 78.0–100.0)
MONO ABS: 0.9 10*3/uL (ref 0.1–1.0)
Monocytes Relative: 8 %
NEUTROS PCT: 80 %
Neutro Abs: 8.7 10*3/uL — ABNORMAL HIGH (ref 1.7–7.7)
Platelets: 183 10*3/uL (ref 150–400)
RBC: 5.85 MIL/uL — ABNORMAL HIGH (ref 4.22–5.81)
RDW: 13.1 % (ref 11.5–15.5)
WBC: 10.9 10*3/uL — ABNORMAL HIGH (ref 4.0–10.5)

## 2015-08-03 LAB — URINALYSIS, ROUTINE W REFLEX MICROSCOPIC
BILIRUBIN URINE: NEGATIVE
Glucose, UA: NEGATIVE mg/dL
HGB URINE DIPSTICK: NEGATIVE
KETONES UR: NEGATIVE mg/dL
Leukocytes, UA: NEGATIVE
NITRITE: NEGATIVE
PROTEIN: NEGATIVE mg/dL
Specific Gravity, Urine: 1.002 — ABNORMAL LOW (ref 1.005–1.030)
pH: 5.5 (ref 5.0–8.0)

## 2015-08-03 LAB — LIPASE, BLOOD: Lipase: 23 U/L (ref 11–51)

## 2015-08-03 LAB — TROPONIN I: Troponin I: <0.03 ng/mL (ref <0.031)

## 2015-08-03 LAB — D-DIMER, QUANTITATIVE (NOT AT ARMC): D DIMER QUANT: 0.34 ug{FEU}/mL (ref 0.00–0.50)

## 2015-08-03 LAB — I-STAT CG4 LACTIC ACID, ED: LACTIC ACID, VENOUS: 2.07 mmol/L — AB (ref 0.5–2.0)

## 2015-08-03 MED ORDER — IOHEXOL 300 MG/ML  SOLN
25.0000 mL | Freq: Once | INTRAMUSCULAR | Status: AC | PRN
  Administered 2015-08-03: 25 mL via ORAL

## 2015-08-03 MED ORDER — DIPHENHYDRAMINE HCL 50 MG/ML IJ SOLN
25.0000 mg | Freq: Once | INTRAMUSCULAR | Status: AC
  Administered 2015-08-03: 25 mg via INTRAVENOUS
  Filled 2015-08-03: qty 1

## 2015-08-03 MED ORDER — MORPHINE SULFATE (PF) 4 MG/ML IV SOLN
4.0000 mg | Freq: Once | INTRAVENOUS | Status: AC
  Administered 2015-08-03: 4 mg via INTRAVENOUS
  Filled 2015-08-03: qty 1

## 2015-08-03 MED ORDER — SODIUM CHLORIDE 0.9 % IV BOLUS (SEPSIS)
1000.0000 mL | Freq: Once | INTRAVENOUS | Status: AC
  Administered 2015-08-03: 1000 mL via INTRAVENOUS

## 2015-08-03 MED ORDER — ONDANSETRON HCL 4 MG/2ML IJ SOLN
4.0000 mg | Freq: Once | INTRAMUSCULAR | Status: AC
  Administered 2015-08-03: 4 mg via INTRAVENOUS
  Filled 2015-08-03: qty 2

## 2015-08-03 MED ORDER — KETOROLAC TROMETHAMINE 30 MG/ML IJ SOLN
30.0000 mg | Freq: Once | INTRAMUSCULAR | Status: AC
  Administered 2015-08-03: 30 mg via INTRAVENOUS
  Filled 2015-08-03: qty 1

## 2015-08-03 MED ORDER — PROCHLORPERAZINE EDISYLATE 5 MG/ML IJ SOLN
10.0000 mg | Freq: Once | INTRAMUSCULAR | Status: AC
  Administered 2015-08-03: 10 mg via INTRAVENOUS
  Filled 2015-08-03: qty 2

## 2015-08-03 MED ORDER — IOHEXOL 300 MG/ML  SOLN
100.0000 mL | Freq: Once | INTRAMUSCULAR | Status: AC | PRN
  Administered 2015-08-03: 100 mL via INTRAVENOUS

## 2015-08-03 NOTE — ED Notes (Signed)
Pt c/o throbbing HA and abd pains, EDP informed

## 2015-08-03 NOTE — ED Provider Notes (Signed)
CSN: [id number]     Arrival date & time 08/03/15  1609 History   First MD Initiated Contact with Patient 08/03/15 1621     Chief Complaint  Patient presents with  . Chest Pain     (Consider location/radiation/quality/duration/timing/severity/associated sxs/prior Treatment) HPI Comments: 40 year old male with past medical history including hypertension, hyperlipidemia, anxiety/depression, and transaminitis who presents with chest pain, shortness of breath, abdominal pain and diarrhea. 3 days ago, the patient states that he was having a migraine and vomited because of the pain. He immediately began having chest pain that has persisted for the past few days. The chest pain is central to left-sided and associated with some shortness of breath. He reports that yesterday he began having diarrhea and abdominal pain and has had multiple episodes of diarrhea since then. He denies any further vomiting but has been severely nauseated. He endorses chills, has not measured his temperature. He has also had cough and nasal congestion during this illness. No personal or family history of blood clots, no recent travel, no history of cancer.  Patient is a 40 y.o. male presenting with chest pain. The history is provided by the patient.  Chest Pain   Past Medical History  Diagnosis Date  . Depression   . Anxiety   . Hypertension   . Hypercholesteremia   . Liver disease    Past Surgical History  Procedure Laterality Date  . Vasectomy    . Meniscus repair     No family history on file. Social History  Substance Use Topics  . Smoking status: Current Every Day Smoker -- 1.00 packs/day for 8 years  . Smokeless tobacco: Former NeurosurgeonUser  . Alcohol Use: Yes     Comment: once/weekly    Review of Systems  Cardiovascular: Positive for chest pain.   10 Systems reviewed and are negative for acute change except as noted in the HPI.    Allergies  Sulfa antibiotics  Home Medications   Prior to Admission  medications   Medication Sig Start Date End Date Taking? Authorizing Provider  clonazePAM (KLONOPIN) 0.5 MG tablet Take 1 tablet (0.5 mg total) by mouth 3 (three) times daily. 07/14/14   Dorothea OgleIskra M Myers, MD  diltiazem (DILACOR XR) 240 MG 24 hr capsule Take 240 mg by mouth daily.    Historical Provider, MD  Multiple Vitamin (MULTIVITAMIN WITH MINERALS) TABS tablet Take 1 tablet by mouth daily.    Historical Provider, MD  traMADol (ULTRAM) 50 MG tablet Take 1 tablet (50 mg total) by mouth every 6 (six) hours as needed. 07/14/14   Dorothea OgleIskra M Myers, MD  valsartan-hydrochlorothiazide (DIOVAN-HCT) 160-12.5 MG per tablet Take 1 tablet by mouth daily.    Historical Provider, MD  venlafaxine XR (EFFEXOR-XR) 150 MG 24 hr capsule Take 1 capsule (150 mg total) by mouth daily. 03/25/12   Tonye Pearsonobert P Doolittle, MD   BP 137/91 mmHg  Pulse 103  Temp(Src) 99 F (37.2 C) (Oral)  Resp 21  Ht 6\' 2"  (1.88 m)  Wt 215 lb (97.523 kg)  BMI 27.59 kg/m2  SpO2 96% Physical Exam  Constitutional: He is oriented to person, place, and time. He appears well-developed and well-nourished. No distress.  Uncomfortable  HENT:  Head: Normocephalic and atraumatic.  Mouth/Throat: Oropharynx is clear and moist.  Moist mucous membranes  Eyes: Conjunctivae are normal. Pupils are equal, round, and reactive to light.  Neck: Neck supple.  Cardiovascular: Regular rhythm and normal heart sounds.  Tachycardia present.   No murmur heard. Pulmonary/Chest:  Effort normal and breath sounds normal.  Abdominal: Soft. Bowel sounds are normal. He exhibits distension. There is tenderness. There is no rebound and no guarding.  Mildly distended but soft abdomen, right upper quadrant tenderness to palpation  Musculoskeletal: He exhibits no edema.  Neurological: He is alert and oriented to person, place, and time.  Fluent speech  Skin: Skin is warm and dry.  Psychiatric: He has a normal mood and affect. Judgment normal.  Nursing note and vitals  reviewed.   ED Course  Procedures (including critical care time) Labs Review Labs Reviewed  COMPREHENSIVE METABOLIC PANEL - Abnormal; Notable for the following:    Sodium 134 (*)    CO2 21 (*)    Glucose, Bld 143 (*)    Total Protein 9.6 (*)    Albumin 5.8 (*)    AST 98 (*)    ALT 174 (*)    All other components within normal limits  CBC WITH DIFFERENTIAL/PLATELET - Abnormal; Notable for the following:    WBC 10.9 (*)    RBC 5.85 (*)    Neutro Abs 8.7 (*)    All other components within normal limits  URINALYSIS, ROUTINE W REFLEX MICROSCOPIC (NOT AT [facility]) - Abnormal; Notable for the following:    APPearance CLOUDY (*)    Specific Gravity, Urine 1.002 (*)    All other components within normal limits  I-STAT CG4 LACTIC ACID, ED - Abnormal; Notable for the following:    Lactic Acid, Venous 2.07 (*)    All other components within normal limits  URINE CULTURE  LIPASE, BLOOD  TROPONIN I  TROPONIN I  D-DIMER, QUANTITATIVE (NOT AT [facility])  I-STAT CG4 LACTIC ACID, ED    Imaging Review Dg Chest 2 View  08/03/2015  CLINICAL DATA:  Chest pain, vomiting 3 days ago, migraine on yesterday, smoker EXAM: CHEST  2 VIEW COMPARISON:  07/13/2014 FINDINGS: Cardiomediastinal silhouette is stable. No acute infiltrate or pleural effusion. No pulmonary edema. Bony thorax is unremarkable. Again noted pectus excavatum deformity. IMPRESSION: No active cardiopulmonary disease. Electronically Signed   By: Natasha Mead M.D.   On: 08/03/2015 17:20   US Abdomen Complete  08/03/2015  CLINICAL DATA:  Right upper quadrant pain and abdominal distention for 2 days. Elevated liver function tests. Previous history of pyelonephritis. EXAM: ABDOMEN ULTRASOUND COMPLETE COMPARISON:  None. FINDINGS: Gallbladder: No gallstones or wall thickening visualized. No sonographic Murphy sign noted by sonographer. Common bile duct: Diameter: 2 mm, within normal limits. Liver: Diffusely increased echogenicity of the hepatic parenchyma,  consistent with hepatic steatosis/diffuse hepatocellular disease. No focal mass lesion identified. Main portal pain is patent, with normal hepatopetal flow direction on color Doppler ultrasound. IVC: No abnormality visualized. Pancreas: Visualized portion unremarkable. Spleen: Mild splenomegaly noted with length approximately 13.3 cm, and estimated volume of 494 mL. Right Kidney: Length: 10.6 cm. Echogenicity within normal limits. No mass or hydronephrosis visualized. Left Kidney: Length: 11.8 cm. Echogenicity within normal limits. No mass or hydronephrosis visualized. Abdominal aorta: No aneurysm visualized. Other findings: None. IMPRESSION: No evidence of gallstones or biliary ductal dilatation. Diffuse hepatic steatosis the/hepatocellular disease. No liver mass visualized. Mild splenomegaly.  No evidence of ascites. Electronically Signed   By: Myles Rosenthal M.D.   On: 08/03/2015 19:36   Ct Abdomen Pelvis W Contrast  08/03/2015  CLINICAL DATA:  Mid abdominal pain and distension. Diarrhea. Tachycardia. Symptoms for 2 days. EXAM: CT ABDOMEN AND PELVIS WITH CONTRAST TECHNIQUE: Multidetector CT imaging of the abdomen and pelvis was performed using  the standard protocol following bolus administration of intravenous contrast. CONTRAST:  OMNIPAQUE IOHEXOL 300 MG/ML SOLN, 25mL OMNIPAQUE IOHEXOL 300 MG/ML SOLN COMPARISON:  Abdominal ultrasound earlier this day.  CT 07/11/2014 FINDINGS: Lower chest: Linear atelectasis in the right greater than left lower lobe. No consolidation. No pleural effusion. A prominent lower paratracheal lymph node is partially included measuring 10 mm. A prominent AP window lymph node partially included measuring 11 mm. Liver: Diffusely decreased hepatic density, no focal lesion. Prominent size, 22 cm craniocaudal. Hepatobiliary: Gallbladder physiologically distended, no calcified stone. No biliary dilatation. Pancreas: No ductal dilatation or inflammation. Spleen: Enlarged measuring 14.5 x  4.7 x 13.2 cm. Volume 550 mL. No focal lesion. Adrenal glands: No nodule. Kidneys: Symmetric renal enhancement. No hydronephrosis. There is minimal residual or recurrent perinephric stranding about the left kidney. Stomach/Bowel: Stomach physiologically distended. There are no dilated or thickened small bowel loops. Liquid stool throughout the colon without colonic wall thickening. The appendix is normal. Vascular/Lymphatic: Small retroperitoneal lymph nodes, not enlarged by size criteria. No mesenteric adenopathy. Abdominal aorta is normal in caliber. Reproductive: Prostate gland remains enlarged. Bladder: Distended, no wall thickening. Other: No free air, free fluid, or intra-abdominal fluid collection. Musculoskeletal: There are no acute or suspicious osseous abnormalities. IMPRESSION: 1. Liquid stool throughout the colon without colonic wall thickening or inflammation, consistent with diarrheal illness. 2. Nonspecific perinephric stranding about the left kidney, persistent or recurrent. 3. Splenomegaly.  Prominent liver size with decreased density. 4. Prominent mediastinal lymph nodes in the prevascular and lower paratracheal stations are partially included. Favor reactive in etiology. Electronically Signed   By: Rubye Oaks M.D.   On: 08/03/2015 22:12   I have personally reviewed and evaluated these lab results as part of my medical decision-making.   EKG Interpretation   Date/Time:  Tuesday August 03 2015 16:15:57 EST Ventricular Rate:  129 PR Interval:  136 QRS Duration: 92 QT Interval:  304 QTC Calculation: 445 R Axis:   84 Text Interpretation:  Sinus tachycardia Incomplete right bundle branch  block Borderline ECG sinus tachycardia is similar to previous EKG  Confirmed by Izumi Mixon MD, Ivanell Deshotel [id number]) on 08/03/2015 4:21:02 PM     Medications  sodium chloride 0.9 % bolus 1,000 mL (0 mLs Intravenous Stopped 08/03/15 1800)  ondansetron (ZOFRAN) injection 4 mg (4 mg Intravenous Given 08/03/15  1648)  morphine 4 MG/ML injection 4 mg (4 mg Intravenous Given 08/03/15 1652)  sodium chloride 0.9 % bolus 1,000 mL (1,000 mLs Intravenous New Bag/Given 08/03/15 2028)  ketorolac (TORADOL) 30 MG/ML injection 30 mg (30 mg Intravenous Given 08/03/15 2035)  diphenhydrAMINE (BENADRYL) injection 25 mg (25 mg Intravenous Given 08/03/15 2106)  prochlorperazine (COMPAZINE) injection 10 mg (10 mg Intravenous Given 08/03/15 2106)  iohexol (OMNIPAQUE) 300 MG/ML solution 25 mL (25 mLs Oral Contrast Given 08/03/15 2144)  iohexol (OMNIPAQUE) 300 MG/ML solution 100 mL (100 mLs Intravenous Contrast Given 08/03/15 2144)    MDM   Final diagnoses:  Diarrhea of presumed infectious origin  Generalized abdominal pain  Chest pain, unspecified chest pain type   Patient with a few days of chest pain and shortness of breath that began after an episode of vomiting in the setting of migraine, as well as 1 day of diarrhea and abdominal pain. On exam, he was uncomfortable but nontoxic. Vital signs notable for tachycardia, heart rate near 110 during my examination but 127 when he checked into triage. He was afebrile. Right upper quadrant tenderness noted. Gave the patient an IV fluid bolus,  Zofran, and morphine. Obtained above lab work as well as chest x-ray. EKG shows sinus tachycardia, no significant changes from previous EKG.  Give the patient migraine cocktail with Toradol, Benadryl, and Compazine.  Serial troponins negative. D-dimer normal which makes PE very unlikely. Repeat lactate after 2 L of IV fluids is normal. UA without any evidence of infection. CBC 10.9. LFTs mildly elevated with AST 98, ALP 174. Obtained abdominal ultrasound which showed abnormal liver but no evidence of biliary obstruction and no ascites. Because the patient's persistent abdominal pain and distention, obtained CT of abdomen which showed liquid stool throughout the colon consistent with diarrheal illness. Splenomegaly and mediastinal lymph nodes also noted.  Patient has persistent nonspecific perinephric stranding but with no fevers or abnormalities on UA I doubt pyelonephritis. On reexamination, the patient states that he feels much improved. His chest pain is resolved. I have discussed supportive care for his diarrhea and emphasized return precautions. Also emphasized importance of follow-up regarding his liver enzymes. Patient voiced understanding and was discharged in satisfactory condition.   Laurence Spates, MD 08/04/15 0030

## 2015-08-03 NOTE — ED Notes (Addendum)
C/o CP, SOB since Saturday-abd pain with diarrhea started yesterday-pt presents to triage drinking water-advised to be NPO-great amount of distention noted to abd-states hx of liver disease with distention but is increased and painful

## 2015-08-03 NOTE — ED Notes (Signed)
Immediately placed on cont cardiac monitoring with cont POX and int NBP q3330min

## 2015-08-03 NOTE — ED Notes (Signed)
MD at bedside. 

## 2015-08-03 NOTE — ED Notes (Signed)
Pt has received 2 liters of NS bolus

## 2015-08-03 NOTE — ED Notes (Signed)
Pt presents with chest pain and feeling weak at times.

## 2015-08-04 LAB — I-STAT CG4 LACTIC ACID, ED: LACTIC ACID, VENOUS: 1.87 mmol/L (ref 0.5–2.0)

## 2015-08-05 LAB — URINE CULTURE
CULTURE: NO GROWTH
Special Requests: NORMAL

## 2017-02-15 IMAGING — CT CT ABD-PELV W/ CM
2 of 4 series · 16 of 46 positions shown, 18 images · IV contrast (APPLIED)
Comparison: Abdominal ultrasound earlier this day.  CT 07/11/2014

CLINICAL DATA: Mid abdominal pain and distension. Diarrhea.
Tachycardia. Symptoms for 2 days.

EXAM:
CT ABDOMEN AND PELVIS WITH CONTRAST
TECHNIQUE: Multidetector CT imaging of the abdomen and pelvis was performed
using the standard protocol following bolus administration of
intravenous contrast.
CONTRAST:  100mL OMNIPAQUE IOHEXOL 300 MG/ML SOLN, 25mL OMNIPAQUE
IOHEXOL 300 MG/ML SOLN

[Series 2: axial st · axial · 0.86mm/px · z∈[-534,-29]mm · 13 of 113 slices shown, 15 images]
[im 6/113  soft-tissue]
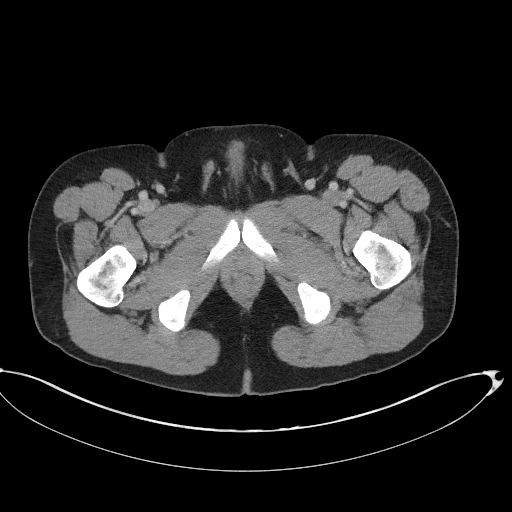
[im 6/113  bone]
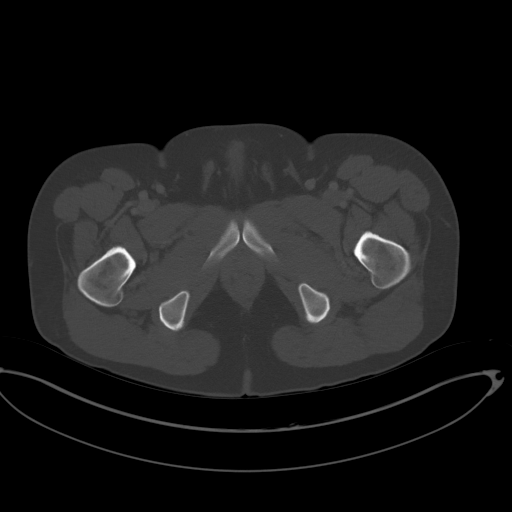
[im 16/113  soft-tissue]
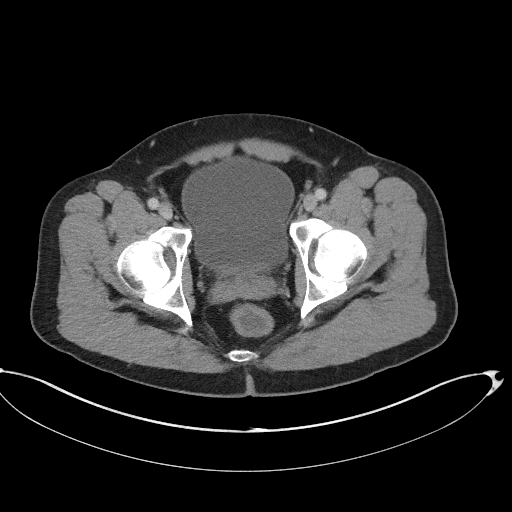
[im 26/113  soft-tissue]
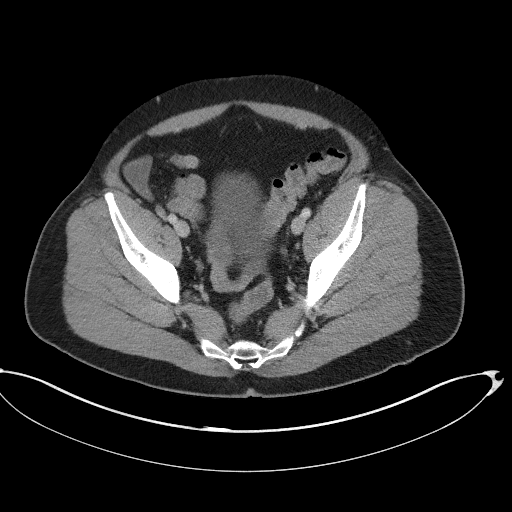
[im 31/113  soft-tissue]
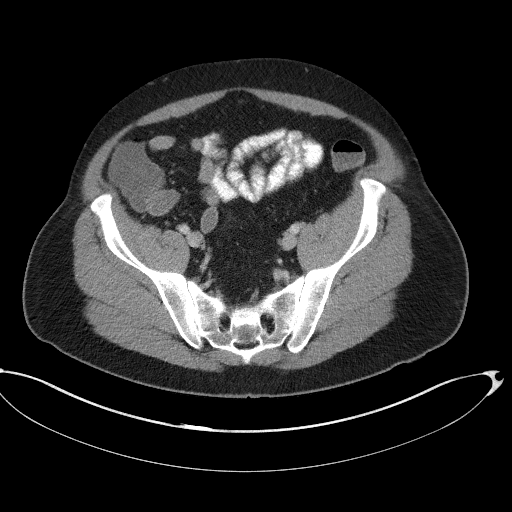
[im 41/113  soft-tissue]
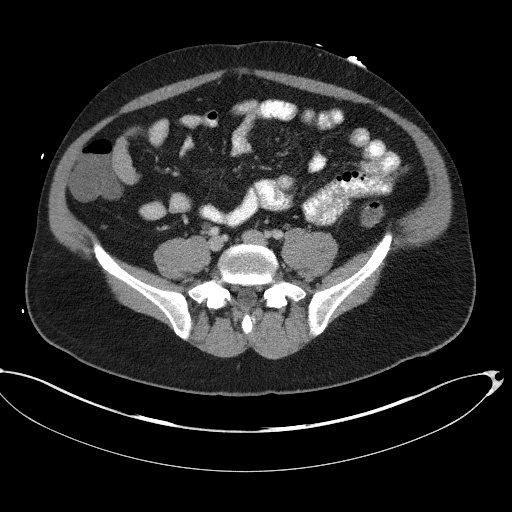
[im 46/113  soft-tissue]
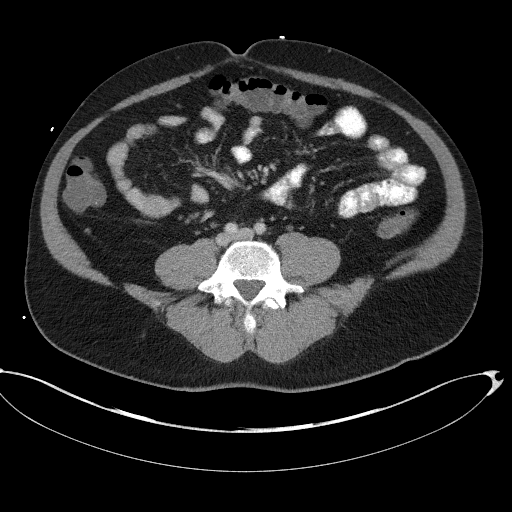
[im 57/113  soft-tissue]
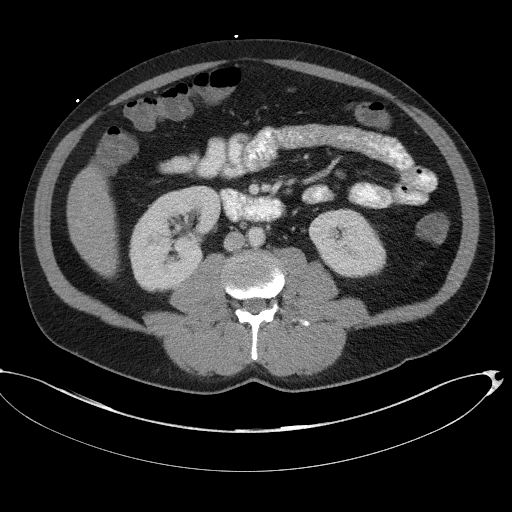
[im 67/113  soft-tissue]
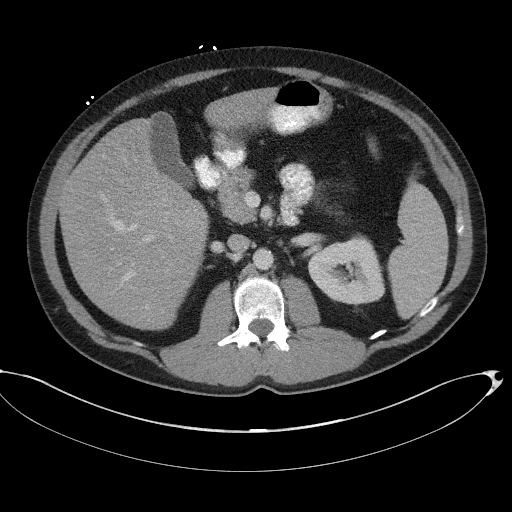
[im 72/113  soft-tissue]
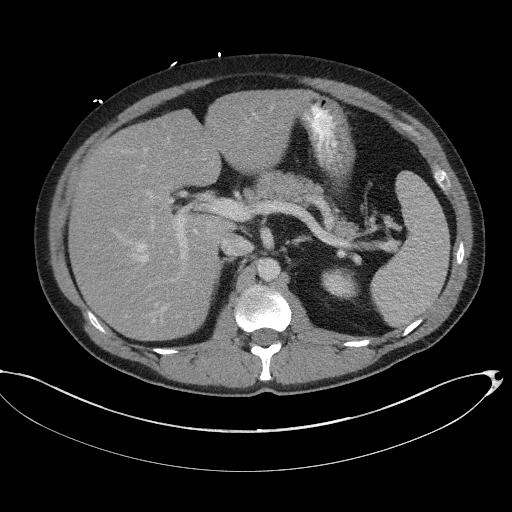
[im 72/113  bone]
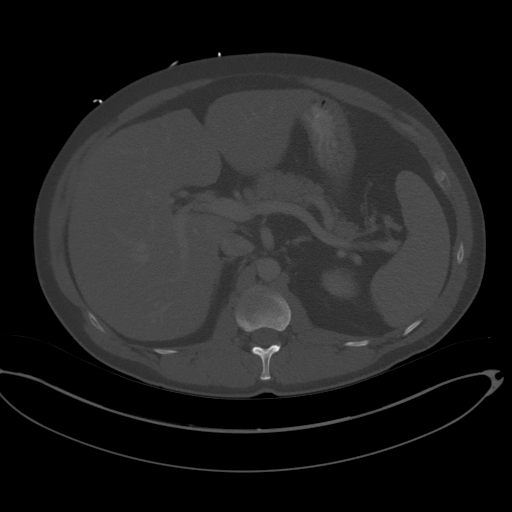
[im 82/113  soft-tissue]
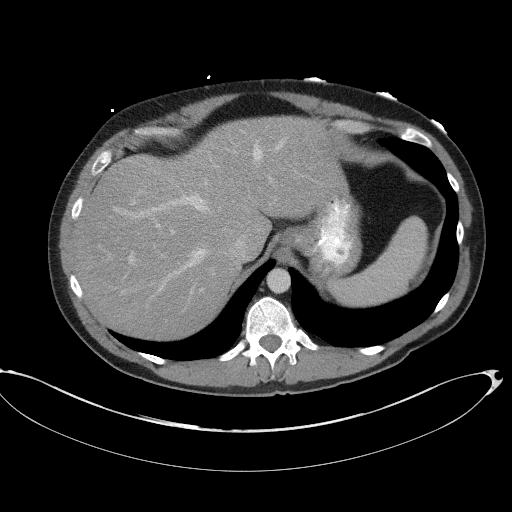
[im 87/113  soft-tissue]
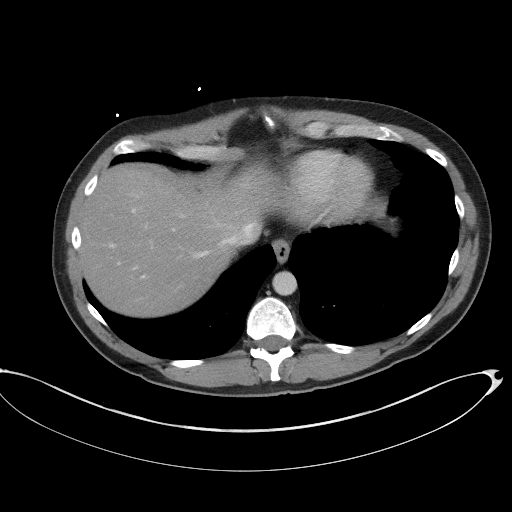
[im 97/113  soft-tissue]
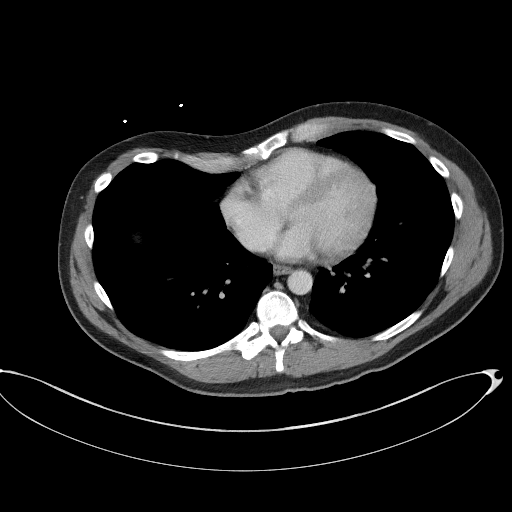
[im 107/113  soft-tissue]
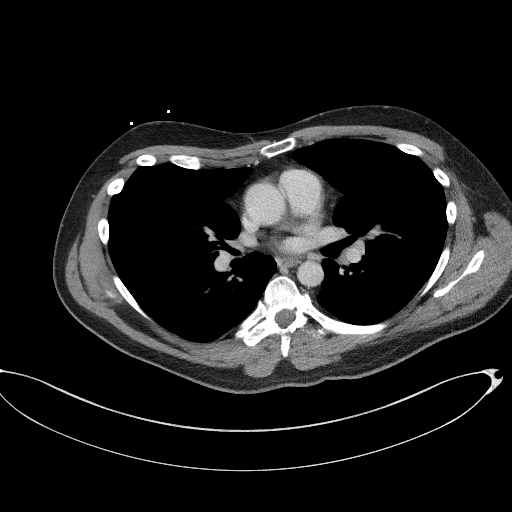

[Series 5: coronal st · coronal · 0.89mm/px · 3 of 94 slices shown]
[im 32/94  soft-tissue]
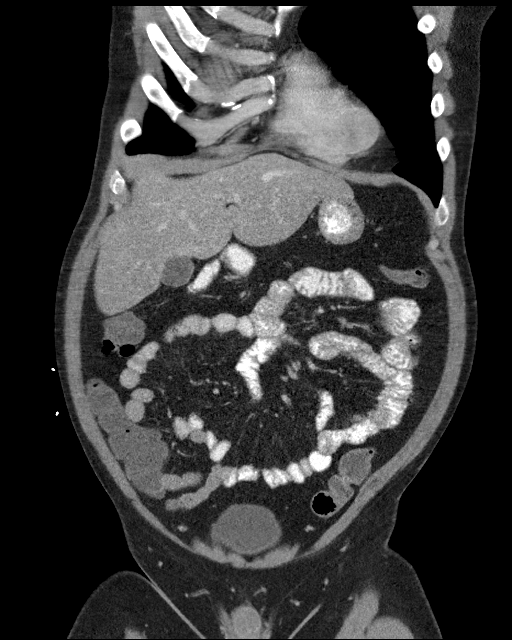
[im 42/94  soft-tissue]
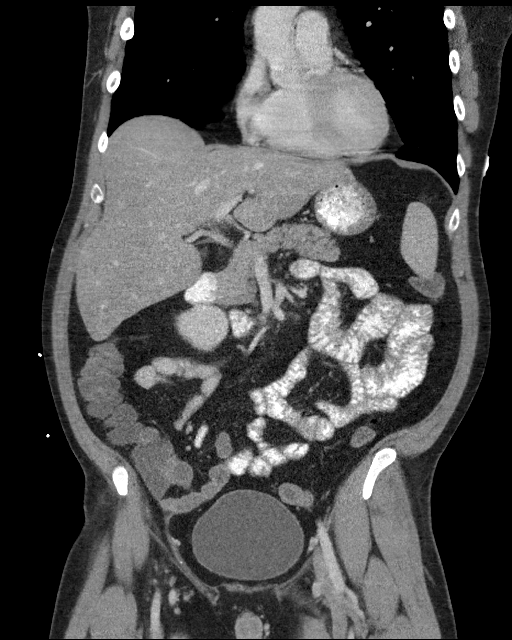
[im 52/94  soft-tissue]
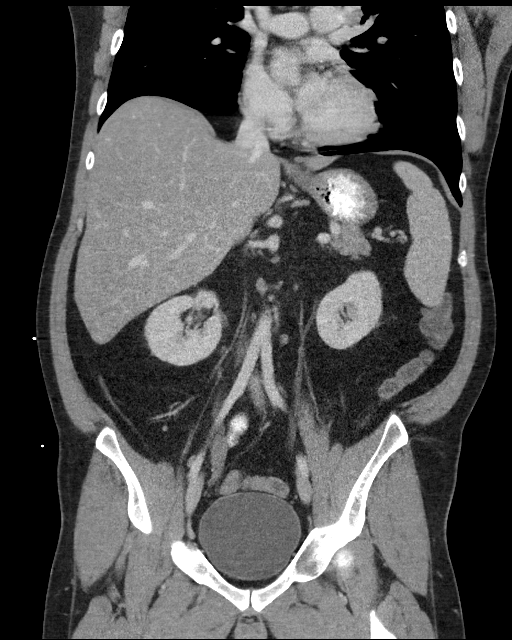

[16 of 46 positions shown; findings below may reference images not displayed]

FINDINGS: Lower chest: Linear atelectasis in the right greater than left lower
lobe. No consolidation. No pleural effusion. A prominent lower
paratracheal lymph node is partially included measuring 10 mm. A
prominent AP window lymph node partially included measuring 11 mm.

Liver: Diffusely decreased hepatic density, no focal lesion.
Prominent size, 22 cm craniocaudal.

Hepatobiliary: Gallbladder physiologically distended, no calcified
stone. No biliary dilatation.

Pancreas: No ductal dilatation or inflammation.

Spleen: Enlarged measuring 14.5 x 4.7 x 13.2 cm. Volume 550 mL. No
focal lesion.

Adrenal glands: No nodule.

Kidneys: Symmetric renal enhancement. No hydronephrosis. There is
minimal residual or recurrent perinephric stranding about the left
kidney.

Stomach/Bowel: Stomach physiologically distended. There are no
dilated or thickened small bowel loops. Liquid stool throughout the
colon without colonic wall thickening. The appendix is normal.

Vascular/Lymphatic: Small retroperitoneal lymph nodes, not enlarged
by size criteria. No mesenteric adenopathy. Abdominal aorta is
normal in caliber.

Reproductive: Prostate gland remains enlarged.

Bladder: Distended, no wall thickening.

Other: No free air, free fluid, or intra-abdominal fluid collection.

Musculoskeletal: There are no acute or suspicious osseous
abnormalities.
IMPRESSION: 1. Liquid stool throughout the colon without colonic wall thickening
or inflammation, consistent with diarrheal illness.
2. Nonspecific perinephric stranding about the left kidney,
persistent or recurrent.
3. Splenomegaly.  Prominent liver size with decreased density.
4. Prominent mediastinal lymph nodes in the prevascular and lower
paratracheal stations are partially included. Favor reactive in
etiology.
